# Patient Record
Sex: Female | Born: 1990 | Race: Asian | Hispanic: No | Smoking: Never smoker
Health system: Southern US, Community
[De-identification: ages and names within clinical notes are randomized; demographics above are authoritative.]

## PROBLEM LIST (undated history)

## (undated) ENCOUNTER — Inpatient Hospital Stay (HOSPITAL_COMMUNITY): Payer: Self-pay

## (undated) DIAGNOSIS — Z789 Other specified health status: Secondary | ICD-10-CM

## (undated) DIAGNOSIS — N939 Abnormal uterine and vaginal bleeding, unspecified: Secondary | ICD-10-CM

## (undated) HISTORY — PX: MOUTH SURGERY: SHX715

## (undated) HISTORY — PX: NO PAST SURGERIES: SHX2092

---

## 2012-08-01 ENCOUNTER — Encounter (HOSPITAL_COMMUNITY): Payer: Self-pay | Admitting: *Deleted

## 2012-08-01 ENCOUNTER — Inpatient Hospital Stay (HOSPITAL_COMMUNITY)
Admission: AD | Admit: 2012-08-01 | Discharge: 2012-08-01 | Disposition: A | Discharge status: Home / Self Care | Payer: No Typology Code available for payment source | Source: Ambulatory Visit | Attending: Obstetrics & Gynecology | Admitting: Obstetrics & Gynecology

## 2012-08-01 DIAGNOSIS — Z3201 Encounter for pregnancy test, result positive: Secondary | ICD-10-CM | POA: Insufficient documentation

## 2012-08-01 HISTORY — DX: Other specified health status: Z78.9

## 2012-08-01 NOTE — MAU Note (Signed)
Just wanting pregnancy verification, no real complaints or problems.

## 2012-08-01 NOTE — MAU Note (Signed)
Last wk has been having occ cramps like period was going to start, never did, also having breast tenderness. 2 pos home tests.

## 2012-08-01 NOTE — MAU Provider Note (Signed)
Attestation of Attending Supervision of Advanced Practitioner (CNM/NP): Evaluation and management procedures were performed by the Advanced Practitioner under my supervision and collaboration.  I have reviewed the Advanced Practitioner's note and chart, and I agree with the management and plan.  HARRAWAY-SMITH, Eleanora Guinyard 3:20 PM

## 2012-08-01 NOTE — MAU Provider Note (Signed)
22 y.o. G1P0 at [redacted]w[redacted]d here requesting pregnancy verification. Denies pain or bleeding. + UPT at home x 2.   BP 117/64  Pulse 73  Temp(Src) 98.1 F (36.7 C) (Oral)  Resp 16  Ht 5' 2.5" (1.588 m)  Wt 126 lb (57.153 kg)  BMI 22.66 kg/m2  LMP 06/08/2012  Gen: well, no distress  Results for orders placed during the hospital encounter of 08/01/12 (from the past 24 hour(s))  POCT PREGNANCY, URINE     Status: Abnormal   Collection Time    08/01/12 12:20 PM      Result Value Range   Preg Test, Ur POSITIVE (*) NEGATIVE    A/P: 1. Positive pregnancy test   Pregnancy verification and list of providers given Precautions rev'd Start care as soon as possible    Medication List     As of 08/01/2012  3:17 PM    Notice      You have not been prescribed any medications.             Follow-up Information   Follow up with provider of your choice. (start prenatal care as soon as possible)

## 2012-10-19 ENCOUNTER — Ambulatory Visit (INDEPENDENT_AMBULATORY_CARE_PROVIDER_SITE_OTHER): Payer: No Typology Code available for payment source | Admitting: Advanced Practice Midwife

## 2012-10-19 ENCOUNTER — Encounter: Payer: Self-pay | Admitting: Advanced Practice Midwife

## 2012-10-19 VITALS — BP 124/84 | Temp 97.7°F | Wt 126.0 lb

## 2012-10-19 DIAGNOSIS — Z3400 Encounter for supervision of normal first pregnancy, unspecified trimester: Secondary | ICD-10-CM

## 2012-10-19 DIAGNOSIS — O0930 Supervision of pregnancy with insufficient antenatal care, unspecified trimester: Secondary | ICD-10-CM | POA: Insufficient documentation

## 2012-10-19 LAB — OB RESULTS CONSOLE GC/CHLAMYDIA
Chlamydia: NEGATIVE
Gonorrhea: NEGATIVE

## 2012-10-19 LAB — POCT URINALYSIS DIP (DEVICE)
Hgb urine dipstick: NEGATIVE
Ketones, ur: NEGATIVE mg/dL
Leukocytes, UA: NEGATIVE
Protein, ur: NEGATIVE mg/dL
pH: 5.5 (ref 5.0–8.0)

## 2012-10-19 NOTE — Progress Notes (Signed)
Pulse: 102

## 2012-10-19 NOTE — Addendum Note (Signed)
Addended by: Franchot Mimes on: 10/19/2012 04:28 PM   Modules accepted: Orders

## 2012-10-19 NOTE — Progress Notes (Signed)
   Subjective:    Deanna Thornton is a G1P0 [redacted]w[redacted]d being seen today for her first obstetrical visit.  Her obstetrical history is significant for None. Patient does intend to breast feed. Pregnancy history fully reviewed.  Patient reports nausea.  Filed Vitals:   10/19/12 1028  BP: 124/84  Temp: 97.7 F (36.5 C)  Weight: 57.153 kg (126 lb)    HISTORY: OB History  Gravida Para Term Preterm AB SAB TAB Ectopic Multiple Living  1             # Outcome Date GA Lbr Len/2nd Weight Sex Delivery Anes PTL Lv  1 CUR              Past Medical History  Diagnosis Date  . Medical history non-contributory    Past Surgical History  Procedure Laterality Date  . No past surgeries     Family History  Problem Relation Age of Onset  . Hypertension Mother   . Hyperthyroidism Mother      Exam    Uterus:     Pelvic Exam:    Perineum: No Hemorrhoids, Normal Perineum   Vulva: normal   Vagina:  normal mucosa, normal discharge   pH:    Cervix: no bleeding following Pap, no cervical motion tenderness, no lesions and nulliparous appearance   Adnexa: normal adnexa and no mass, fullness, tenderness   Bony Pelvis: average  System: Breast:  normal appearance, no masses or tenderness   Skin: normal coloration and turgor, no rashes    Neurologic: oriented, normal mood, gait normal; reflexes normal and symmetric   Extremities: normal strength, tone, and muscle mass   HEENT neck supple with midline trachea and thyroid without masses   Mouth/Teeth mucous membranes moist, pharynx normal without lesions and dental hygiene good   Neck supple and no masses   Cardiovascular: regular rate and rhythm   Respiratory:  appears well, vitals normal, no respiratory distress, acyanotic, normal RR, ear and throat exam is normal, neck free of mass or lymphadenopathy, chest clear, no wheezing, crepitations, rhonchi, normal symmetric air entry   Abdomen: soft, non-tender; bowel sounds normal; no masses,  no organomegaly    Urinary: urethral meatus normal      Assessment:    Pregnancy: G1P0 Patient Active Problem List   Diagnosis Date Noted  . Supervision of normal first pregnancy 10/19/2012        Plan:     Initial labs drawn. Prenatal vitamins. Problem list reviewed and updated. Genetic Screening discussed Quad Screen: declined.  Ultrasound discussed; fetal survey: ordered.  Follow up in 4 weeks. 50% of 30 min visit spent on counseling and coordination of care.     LEFTWICH-KIRBY, Orion Mole 10/19/2012

## 2012-10-19 NOTE — Addendum Note (Signed)
Addended by: Sherre Lain A on: 10/19/2012 03:01 PM   Modules accepted: Orders

## 2012-10-21 LAB — CULTURE, OB URINE

## 2012-10-26 ENCOUNTER — Other Ambulatory Visit: Payer: Self-pay | Admitting: Advanced Practice Midwife

## 2012-10-26 ENCOUNTER — Ambulatory Visit (HOSPITAL_COMMUNITY)
Admission: RE | Admit: 2012-10-26 | Discharge: 2012-10-26 | Disposition: A | Discharge status: Home / Self Care | Payer: No Typology Code available for payment source | Source: Ambulatory Visit | Attending: Obstetrics & Gynecology | Admitting: Obstetrics & Gynecology

## 2012-10-26 ENCOUNTER — Ambulatory Visit (HOSPITAL_COMMUNITY)
Admission: RE | Admit: 2012-10-26 | Discharge: 2012-10-26 | Disposition: A | Discharge status: Home / Self Care | Payer: No Typology Code available for payment source | Source: Ambulatory Visit | Attending: Advanced Practice Midwife | Admitting: Advanced Practice Midwife

## 2012-10-26 DIAGNOSIS — Z363 Encounter for antenatal screening for malformations: Secondary | ICD-10-CM | POA: Insufficient documentation

## 2012-10-26 DIAGNOSIS — O358XX0 Maternal care for other (suspected) fetal abnormality and damage, not applicable or unspecified: Secondary | ICD-10-CM | POA: Insufficient documentation

## 2012-10-26 DIAGNOSIS — Z3400 Encounter for supervision of normal first pregnancy, unspecified trimester: Secondary | ICD-10-CM

## 2012-10-26 DIAGNOSIS — Z1389 Encounter for screening for other disorder: Secondary | ICD-10-CM | POA: Insufficient documentation

## 2012-11-07 ENCOUNTER — Other Ambulatory Visit: Payer: Self-pay | Admitting: Advanced Practice Midwife

## 2012-11-07 DIAGNOSIS — O350XX0 Maternal care for (suspected) central nervous system malformation in fetus, not applicable or unspecified: Secondary | ICD-10-CM | POA: Insufficient documentation

## 2012-11-07 DIAGNOSIS — Z3400 Encounter for supervision of normal first pregnancy, unspecified trimester: Secondary | ICD-10-CM

## 2012-11-09 NOTE — Progress Notes (Signed)
Attempted to call patient. NO answer, left message to call clinic back for some important message.

## 2012-11-16 ENCOUNTER — Ambulatory Visit (INDEPENDENT_AMBULATORY_CARE_PROVIDER_SITE_OTHER): Payer: No Typology Code available for payment source | Admitting: Family Medicine

## 2012-11-16 ENCOUNTER — Encounter: Payer: Self-pay | Admitting: *Deleted

## 2012-11-16 ENCOUNTER — Encounter: Payer: Self-pay | Admitting: Family Medicine

## 2012-11-16 VITALS — BP 109/74 | Temp 98.7°F | Wt 131.8 lb

## 2012-11-16 DIAGNOSIS — Z23 Encounter for immunization: Secondary | ICD-10-CM

## 2012-11-16 DIAGNOSIS — O0930 Supervision of pregnancy with insufficient antenatal care, unspecified trimester: Secondary | ICD-10-CM

## 2012-11-16 DIAGNOSIS — Z3402 Encounter for supervision of normal first pregnancy, second trimester: Secondary | ICD-10-CM

## 2012-11-16 LAB — POCT URINALYSIS DIP (DEVICE)
Bilirubin Urine: NEGATIVE
Ketones, ur: NEGATIVE mg/dL
Protein, ur: NEGATIVE mg/dL
Specific Gravity, Urine: >=1.03 (ref 1.005–1.030)
pH: 6 (ref 5.0–8.0)

## 2012-11-16 MED ORDER — INFLUENZA VIRUS VACC SPLIT PF IM SUSP
0.5000 mL | Freq: Once | INTRAMUSCULAR | Status: AC
  Administered 2012-11-16: 0.5 mL via INTRAMUSCULAR

## 2012-11-16 MED ORDER — PRENATAL VITAMINS 0.8 MG PO TABS: 1.0000 | ORAL_TABLET | Freq: Every day | ORAL | Status: DC

## 2012-11-16 NOTE — Progress Notes (Signed)
P=85,  

## 2012-11-16 NOTE — Patient Instructions (Signed)
Pregnancy - Second Trimester The second trimester of pregnancy (3 to 6 months) is a period of rapid growth for you and your baby. At the end of the sixth month, your baby is about 9 inches long and weighs 1 1/2 pounds. You will begin to feel the baby move between 18 and 20 weeks of the pregnancy. This is called quickening. Weight gain is faster. A clear fluid (colostrum) may leak out of your breasts. You may feel small contractions of the womb (uterus). This is known as false labor or Braxton-Hicks contractions. This is like a practice for labor when the baby is ready to be born. Usually, the problems with morning sickness have usually passed by the end of your first trimester. Some women develop small dark blotches (called cholasma, mask of pregnancy) on their face that usually goes away after the baby is born. Exposure to the sun makes the blotches worse. Acne may also develop in some pregnant women and pregnant women who have acne, may find that it goes away. PRENATAL EXAMS  Blood work may continue to be done during prenatal exams. These tests are done to check on your health and the probable health of your baby. Blood work is used to follow your blood levels (hemoglobin). Anemia (low hemoglobin) is common during pregnancy. Iron and vitamins are given to help prevent this. You will also be checked for diabetes between 24 and 28 weeks of the pregnancy. Some of the previous blood tests may be repeated.  The size of the uterus is measured during each visit. This is to make sure that the baby is continuing to grow properly according to the dates of the pregnancy.  Your blood pressure is checked every prenatal visit. This is to make sure you are not getting toxemia.  Your urine is checked to make sure you do not have an infection, diabetes or protein in the urine.  Your weight is checked often to make sure gains are happening at the suggested rate. This is to ensure that both you and your baby are  growing normally.  Sometimes, an ultrasound is performed to confirm the proper growth and development of the baby. This is a test which bounces harmless sound waves off the baby so your caregiver can more accurately determine due dates. Sometimes, a test is done on the amniotic fluid surrounding the baby. This test is called an amniocentesis. The amniotic fluid is obtained by sticking a needle into the belly (abdomen). This is done to check the chromosomes in instances where there is a concern about possible genetic problems with the baby. It is also sometimes done near the end of pregnancy if an early delivery is required. In this case, it is done to help make sure the baby's lungs are mature enough for the baby to live outside of the womb. CHANGES OCCURING IN THE SECOND TRIMESTER OF PREGNANCY Your body goes through many changes during pregnancy. They vary from person to person. Talk to your caregiver about changes you notice that you are concerned about.  During the second trimester, you will likely have an increase in your appetite. It is normal to have cravings for certain foods. This varies from person to person and pregnancy to pregnancy.  Your lower abdomen will begin to bulge.  You may have to urinate more often because the uterus and baby are pressing on your bladder. It is also common to get more bladder infections during pregnancy. You can help this by drinking lots of fluids   and emptying your bladder before and after intercourse.  You may begin to get stretch marks on your hips, abdomen, and breasts. These are normal changes in the body during pregnancy. There are no exercises or medicines to take that prevent this change.  You may begin to develop swollen and bulging veins (varicose veins) in your legs. Wearing support hose, elevating your feet for 15 minutes, 3 to 4 times a day and limiting salt in your diet helps lessen the problem.  Heartburn may develop as the uterus grows and  pushes up against the stomach. Antacids recommended by your caregiver helps with this problem. Also, eating smaller meals 4 to 5 times a day helps.  Constipation can be treated with a stool softener or adding bulk to your diet. Drinking lots of fluids, and eating vegetables, fruits, and whole grains are helpful.  Exercising is also helpful. If you have been very active up until your pregnancy, most of these activities can be continued during your pregnancy. If you have been less active, it is helpful to start an exercise program such as walking.  Hemorrhoids may develop at the end of the second trimester. Warm sitz baths and hemorrhoid cream recommended by your caregiver helps hemorrhoid problems.  Backaches may develop during this time of your pregnancy. Avoid heavy lifting, wear low heal shoes, and practice good posture to help with backache problems.  Some pregnant women develop tingling and numbness of their hand and fingers because of swelling and tightening of ligaments in the wrist (carpel tunnel syndrome). This goes away after the baby is born.  As your breasts enlarge, you may have to get a bigger bra. Get a comfortable, cotton, support bra. Do not get a nursing bra until the last month of the pregnancy if you will be nursing the baby.  You may get a dark line from your belly button to the pubic area called the linea nigra.  You may develop rosy cheeks because of increase blood flow to the face.  You may develop spider looking lines of the face, neck, arms, and chest. These go away after the baby is born. HOME CARE INSTRUCTIONS   It is extremely important to avoid all smoking, herbs, alcohol, and unprescribed drugs during your pregnancy. These chemicals affect the formation and growth of the baby. Avoid these chemicals throughout the pregnancy to ensure the delivery of a healthy infant.  Most of your home care instructions are the same as suggested for the first trimester of your  pregnancy. Keep your caregiver's appointments. Follow your caregiver's instructions regarding medicine use, exercise, and diet.  During pregnancy, you are providing food for you and your baby. Continue to eat regular, well-balanced meals. Choose foods such as meat, fish, milk and other low fat dairy products, vegetables, fruits, and whole-grain breads and cereals. Your caregiver will tell you of the ideal weight gain.  A physical sexual relationship may be continued up until near the end of pregnancy if there are no other problems. Problems could include early (premature) leaking of amniotic fluid from the membranes, vaginal bleeding, abdominal pain, or other medical or pregnancy problems.  Exercise regularly if there are no restrictions. Check with your caregiver if you are unsure of the safety of some of your exercises. The greatest weight gain will occur in the last 2 trimesters of pregnancy. Exercise will help you:  Control your weight.  Get you in shape for labor and delivery.  Lose weight after you have the baby.  Wear   a good support or jogging bra for breast tenderness during pregnancy. This may help if worn during sleep. Pads or tissues may be used in the bra if you are leaking colostrum.  Do not use hot tubs, steam rooms or saunas throughout the pregnancy.  Wear your seat belt at all times when driving. This protects you and your baby if you are in an accident.  Avoid raw meat, uncooked cheese, cat litter boxes, and soil used by cats. These carry germs that can cause birth defects in the baby.  The second trimester is also a good time to visit your dentist for your dental health if this has not been done yet. Getting your teeth cleaned is okay. Use a soft toothbrush. Brush gently during pregnancy.  It is easier to leak urine during pregnancy. Tightening up and strengthening the pelvic muscles will help with this problem. Practice stopping your urination while you are going to the  bathroom. These are the same muscles you need to strengthen. It is also the muscles you would use as if you were trying to stop from passing gas. You can practice tightening these muscles up 10 times a set and repeating this about 3 times per day. Once you know what muscles to tighten up, do not perform these exercises during urination. It is more likely to contribute to an infection by backing up the urine.  Ask for help if you have financial, counseling, or nutritional needs during pregnancy. Your caregiver will be able to offer counseling for these needs as well as refer you for other special needs.  Your skin may become oily. If so, wash your face with mild soap, use non-greasy moisturizer and oil or cream based makeup. MEDICINES AND DRUG USE IN PREGNANCY  Take prenatal vitamins as directed. The vitamin should contain 1 milligram of folic acid. Keep all vitamins out of reach of children. Only a couple vitamins or tablets containing iron may be fatal to a baby or young child when ingested.  Avoid use of all medicines, including herbs, over-the-counter medicines, not prescribed or suggested by your caregiver. Only take over-the-counter or prescription medicines for pain, discomfort, or fever as directed by your caregiver. Do not use aspirin.  Let your caregiver also know about herbs you may be using.  Alcohol is related to a number of birth defects. This includes fetal alcohol syndrome. All alcohol, in any form, should be avoided completely. Smoking will cause low birth rate and premature babies.  Street or illegal drugs are very harmful to the baby. They are absolutely forbidden. A baby born to an addicted mother will be addicted at birth. The baby will go through the same withdrawal an adult does. SEEK MEDICAL CARE IF:  You have any concerns or worries during your pregnancy. It is better to call with your questions if you feel they cannot wait, rather than worry about them. SEEK IMMEDIATE  MEDICAL CARE IF:   An unexplained oral temperature above 102 F (38.9 C) develops, or as your caregiver suggests.  You have leaking of fluid from the vagina (birth canal). If leaking membranes are suspected, take your temperature and tell your caregiver of this when you call.  There is vaginal spotting, bleeding, or passing clots. Tell your caregiver of the amount and how many pads are used. Light spotting in pregnancy is common, especially following intercourse.  You develop a bad smelling vaginal discharge with a change in the color from clear to white.  You continue to feel   sick to your stomach (nauseated) and have no relief from remedies suggested. You vomit blood or coffee ground-like materials.  You lose more than 2 pounds of weight or gain more than 2 pounds of weight over 1 week, or as suggested by your caregiver.  You notice swelling of your face, hands, feet, or legs.  You get exposed to German measles and have never had them.  You are exposed to fifth disease or chickenpox.  You develop belly (abdominal) pain. Round ligament discomfort is a common non-cancerous (benign) cause of abdominal pain in pregnancy. Your caregiver still must evaluate you.  You develop a bad headache that does not go away.  You develop fever, diarrhea, pain with urination, or shortness of breath.  You develop visual problems, blurry, or double vision.  You fall or are in a car accident or any kind of trauma.  There is mental or physical violence at home. Document Released: 01/27/2001 Document Revised: 10/28/2011 Document Reviewed: 08/01/2008 [employer] Patient Information 2014 [employer].  

## 2012-11-16 NOTE — Progress Notes (Signed)
S: pt is a 22 yo G1 @[redacted]w[redacted]d  here for robv - no ctx, lof, vb. +FM - has gained 3lbs  O: see flowsheet  A/P - somehow missed the initial labs. Will draw today - flu shot today - choroid plexus cyst and echogenic focus on [place] and was supposed to see MFM but appt wasn't scheduled. appt scheduled today - weight gain of 3 lbs. Discussed normal weight gain. Will con to monitor - PNV sent to pharmacy  - f/u in 4 weeks.

## 2012-11-17 LAB — OBSTETRIC PANEL
Basophils Absolute: 0 10*3/uL (ref 0.0–0.1)
Basophils Relative: 0 % (ref 0–1)
HCT: 34 % — ABNORMAL LOW (ref 36.0–46.0)
Hepatitis B Surface Ag: NEGATIVE
Lymphocytes Relative: 18 % (ref 12–46)
MCHC: 35.6 g/dL (ref 30.0–36.0)
Neutro Abs: 5.5 10*3/uL (ref 1.7–7.7)
Neutrophils Relative %: 75 % (ref 43–77)
Platelets: 188 10*3/uL (ref 150–400)
RDW: 13.5 % (ref 11.5–15.5)
Rubella: 1.41 Index — ABNORMAL HIGH (ref <0.90)
WBC: 7.4 10*3/uL (ref 4.0–10.5)

## 2012-11-22 ENCOUNTER — Other Ambulatory Visit: Payer: Self-pay | Admitting: Family

## 2012-11-22 DIAGNOSIS — O283 Abnormal ultrasonic finding on antenatal screening of mother: Secondary | ICD-10-CM

## 2012-11-23 ENCOUNTER — Ambulatory Visit (HOSPITAL_COMMUNITY)
Admission: RE | Admit: 2012-11-23 | Discharge: 2012-11-23 | Disposition: A | Discharge status: Home / Self Care | Payer: No Typology Code available for payment source | Source: Ambulatory Visit | Attending: Family Medicine | Admitting: Family Medicine

## 2012-11-23 ENCOUNTER — Encounter (HOSPITAL_COMMUNITY): Payer: Self-pay

## 2012-11-23 DIAGNOSIS — O358XX0 Maternal care for other (suspected) fetal abnormality and damage, not applicable or unspecified: Secondary | ICD-10-CM | POA: Insufficient documentation

## 2012-11-23 DIAGNOSIS — O283 Abnormal ultrasonic finding on antenatal screening of mother: Secondary | ICD-10-CM

## 2012-11-23 NOTE — Progress Notes (Signed)
Deanna Thornton  was seen today for an ultrasound appointment.  See full report in [facility].  Comments: An echogenic focus was seen in the left cardiac ventricle.  This is felt to represent a calcified papillary muscle, and is not associated with structural or functional cardiac abnormalities.  Although an echogenic cardiac focus may be associated with an increased risk of Down syndrome, this risk is felt to be minimal, especially when it is seen as an isolated finding.    Impression: Single IUP at 22 2/7 weeks Echogenic intracardiac focus noted in the left ventricle (see comments) The previously noted choroid plexus cysts are not visualized today The fetal anatomic survey is otherwise within normal limits No other markers associated with aneuploidy were noted Normal amniotic fluid volume  Recommendations: Follow-up ultrasounds as clinically indicated.   Alpha Gula, MD

## 2012-11-23 NOTE — Progress Notes (Signed)
Genetic Counseling  High-Risk Gestation Note  Appointment Date:  11/23/2012 Referred By: Deanna Haven, MD Date of Birth:  1990-08-23 Partner:  Deanna Thornton   Pregnancy History: G1P0 Estimated Date of Delivery: 03/15/13 Estimated Gestational Age: [redacted]w[redacted]d Attending: Alpha Gula, MD  Deanna Thornton and her partner, Deanna Thornton, were seen for genetic counseling because of abnormal ultrasound findings.    Deanna Thornton [facility] [place] on October 26, 2012 for ultrasound and consultation.  Ultrasound revealed an echogenic intracardiac focus (EIF) and bilateral choroid plexus cysts. The ultrasound report was documented separately.  She returned today for a follow-up ultrasound; the choroid plexus cysts were resolved, but the EIF was still present.  We discussed that the second trimester genetic sonogram is targeted at identifying features associated with aneuploidy.  It has evolved as a screening tool used to provide an individualized risk assessment for Down syndrome and other trisomies.  The ability of sonography to aid in the detection of aneuploidies relies on identification of both major structural anomalies and "soft markers."  The patient was counseled that the latter term refers to findings that are often normal variants and do not cause any significant medical problems.  Nonetheless, these markers have a known association with aneuploidy.    The patient was counseled that an EIF is characterized by calcified papillary muscle leading to a discreet dot in the left, or less commonly, the right ventricle.  We discussed that this finding is typically considered to be a benign variant; however, the risk of aneuploidy is increased when this marker is found in patients who have additional risk factors for fetal aneuploidy (AMA, abnormal screening test, or other markers or anomalies by fetal ultrasound).   We then discussed that the choroid plexus is an  area in the brain where cerebral spinal fluid, the fluid that bathes the brain and spinal cord, is made.  Cysts, or fluid filled sacs, are sometimes found in the choroid plexus of babies both before and after they are born.  We discussed that approximately 1-3% of pregnancies evaluated by ultrasound will show choroid plexus cyst (CPCs).  Literature suggests that CPCs are an ultrasound finding in approximately 30-50% of fetuses with trisomy 18, but are an isolated finding in less than 10% of fetuses with trisomy 51.  Deanna Thornton was counseled that when a patient has other risk factors for fetal trisomy 41 (abnormal First trimester or quad screening, advanced maternal age, or another ultrasound finding), CPCs are associated with an increased risk (LR of 9) for trisomy 54.  Newer literature suggests that in the absence of other risk factors, CPCs are likely a normal variation of development or a benign finding.  CPCs are not associated with an increased risk for fetal Down syndrome. We discussed that although the fetus had two markers for fetal aneuploidy, these markers are specific for different conditions.  Considering Deanna Thornton's maternal age of 27, and that she does not have other risk factors for fetal aneuploidy, we discussed that the risk of fetal aneuploidy in the pregnancy is expected to be low (<1%).  We reviewed chromosomes, nondisjunction, and the common features and prognosis of Down syndrome and trisomy 75.  We reviewed other available screening and diagnostic options including noninvasive prenatal screening (NIPS)/cell free fetal DNA (cffDNA) testing, and amniocentesis.  She was counseled regarding the benefits and limitations of each option.  We reviewed the approximate 1 in 300-500 risk for  complications for amniocentesis, including spontaneous pregnancy loss. After consideration of all the options, this couple declined the options of additional testing.  Both family histories were briefly reviewed  and found to be noncontributory for birth defects, mental retardation, and known genetic conditions. Without further information regarding the provided family history, an accurate genetic risk cannot be calculated. Further genetic counseling is warranted if more information is obtained.  Deanna Thornton denied exposure to environmental toxins or chemical agents. She denied the use of alcohol, tobacco or street drugs. She denied significant viral illnesses during the course of her pregnancy.   I counseled this couple regarding the above risks and available options.  The approximate face-to-face time with the genetic counselor was 24 minutes.  Deanna Prose, MS Certified Genetic Counselor

## 2012-11-25 ENCOUNTER — Encounter: Payer: Self-pay | Admitting: Family

## 2012-12-14 ENCOUNTER — Ambulatory Visit (INDEPENDENT_AMBULATORY_CARE_PROVIDER_SITE_OTHER): Payer: No Typology Code available for payment source | Admitting: Family

## 2012-12-14 VITALS — BP 102/70 | Temp 98.1°F | Wt 134.5 lb

## 2012-12-14 DIAGNOSIS — O0930 Supervision of pregnancy with insufficient antenatal care, unspecified trimester: Secondary | ICD-10-CM

## 2012-12-14 DIAGNOSIS — Z23 Encounter for immunization: Secondary | ICD-10-CM

## 2012-12-14 DIAGNOSIS — O0932 Supervision of pregnancy with insufficient antenatal care, second trimester: Secondary | ICD-10-CM

## 2012-12-14 LAB — CBC
HCT: 33.1 % — ABNORMAL LOW (ref 36.0–46.0)
Hemoglobin: 11.5 g/dL — ABNORMAL LOW (ref 12.0–15.0)
MCH: 31.3 pg (ref 26.0–34.0)
MCHC: 34.7 g/dL (ref 30.0–36.0)
RBC: 3.67 MIL/uL — ABNORMAL LOW (ref 3.87–5.11)

## 2012-12-14 LAB — POCT URINALYSIS DIP (DEVICE)
Hgb urine dipstick: NEGATIVE
Ketones, ur: NEGATIVE mg/dL
Protein, ur: NEGATIVE mg/dL
Specific Gravity, Urine: >=1.03 (ref 1.005–1.030)
Urobilinogen, UA: 0.2 mg/dL (ref 0.0–1.0)

## 2012-12-14 MED ORDER — TETANUS-DIPHTH-ACELL PERTUSSIS 5-2.5-18.5 LF-MCG/0.5 IM SUSP
0.5000 mL | Freq: Once | INTRAMUSCULAR | Status: AC
  Administered 2012-12-14: 0.5 mL via INTRAMUSCULAR

## 2012-12-14 NOTE — Addendum Note (Signed)
Addended by: Franchot Mimes on: 12/14/2012 11:49 AM   Modules accepted: Orders

## 2012-12-14 NOTE — Progress Notes (Signed)
Reports doing well; no questions or concerns.  28 wk labs today.

## 2012-12-14 NOTE — Progress Notes (Signed)
P = 87 

## 2012-12-15 ENCOUNTER — Encounter: Payer: Self-pay | Admitting: Family

## 2012-12-15 LAB — RPR

## 2012-12-15 LAB — HIV ANTIBODY (ROUTINE TESTING W REFLEX): HIV: NONREACTIVE

## 2012-12-15 LAB — GLUCOSE TOLERANCE, 1 HOUR (50G) W/O FASTING: Glucose, 1 Hour GTT: 142 mg/dL — ABNORMAL HIGH (ref 70–140)

## 2012-12-28 ENCOUNTER — Ambulatory Visit (INDEPENDENT_AMBULATORY_CARE_PROVIDER_SITE_OTHER): Payer: No Typology Code available for payment source | Admitting: Advanced Practice Midwife

## 2012-12-28 VITALS — BP 105/58 | Wt 138.8 lb

## 2012-12-28 DIAGNOSIS — O350XX0 Maternal care for (suspected) central nervous system malformation in fetus, not applicable or unspecified: Secondary | ICD-10-CM

## 2012-12-28 DIAGNOSIS — R7309 Other abnormal glucose: Secondary | ICD-10-CM | POA: Insufficient documentation

## 2012-12-28 DIAGNOSIS — O0930 Supervision of pregnancy with insufficient antenatal care, unspecified trimester: Secondary | ICD-10-CM

## 2012-12-28 DIAGNOSIS — O99810 Abnormal glucose complicating pregnancy: Secondary | ICD-10-CM

## 2012-12-28 LAB — POCT URINALYSIS DIP (DEVICE)
Glucose, UA: NEGATIVE mg/dL
Hgb urine dipstick: NEGATIVE
Ketones, ur: NEGATIVE mg/dL
Specific Gravity, Urine: 1.01 (ref 1.005–1.030)

## 2012-12-28 NOTE — Patient Instructions (Signed)
Glucose, Blood Sugar, Fasting Blood Sugar This is a test to measure your blood sugar. Glucose is a simple sugar that serves as the main source of energy for the body. The carbohydrates we eat are broken down into glucose (and a few other simple sugars), absorbed by the small intestine, and circulated throughout the body. Most of the body's cells require glucose for energy production; brain and nervous system cells not only rely on glucose for energy, they can only function when glucose levels in the blood remain above a certain level.  The body's use of glucose hinges on the availability of insulin, a hormone produced by the pancreas. Insulin acts as a Engineer, maintenance, transporting glucose into the body's cells, directing the body to store excess glucose as glycogen (for short-term storage) and/or as triglycerides in fat cells. We can not live without glucose or insulin, and they must be in balance.  Normally, blood glucose levels rise slightly after a meal, and insulin is secreted to lower them, with the amount of insulin released matched up with the size and content of the meal. If blood glucose levels drop too low, such as might occur in between meals or after a strenuous workout, glucagon (another pancreatic hormone) is secreted to tell the liver to turn some glycogen back into glucose, raising the blood glucose levels. If the glucose/insulin feedback mechanism is working properly, the amount of glucose in the blood remains fairly stable. If the balance is disrupted and glucose levels in the blood rise, then the body tries to restore the balance, both by increasing insulin production and by excreting glucose in the urine.  PREPARATION FOR TEST A blood sample drawn from a vein in your arm or, for a self check, a drop of blood from a skin prick; in general, it may be recommended that you fast before having a blood glucose test; sometimes a random (no preparation) urine sample is used. Your caregiver will  instruct you as to what they want prior to your testing. NORMAL FINDINGS Normal values depend on many factors. Your lab will provide a range of normal values with your test results. The following information summarizes the meaning of the test results. These are based on the clinical practice recommendations of the [employer] Diabetes Association.  FASTING BLOOD GLUCOSE  From 70 to 99 mg/dL (3.9 to 5.5 mmol/L): Normal glucose tolerance  From 100 to 125 mg/dL (5.6 to 6.9 mmol/L):Impaired fasting glucose (pre-diabetes)  126 mg/dL (7.0 mmol/L) and above on more than one testing occasion: Diabetes ORAL GLUCOSE TOLERANCE TEST (OGTT) [EXCEPT PREGNANCY] (2 HOURS AFTER A 75-GRAM GLUCOSE DRINK)  Less than 140 mg/dL (7.8 mmol/L): Normal glucose tolerance  From 140 to 200 mg/dL (7.8 to 40.0 mmol/L): Impaired glucose tolerance (pre-diabetes)  Over 200 mg/dL (86.7 mmol/L) on more than one testing occasion: Diabetes GESTATIONAL DIABETES SCREENING: GLUCOSE CHALLENGE TEST (1 HOUR AFTER A 50-GRAM GLUCOSE DRINK)  Less than 140* mg/dL (7.8 mmol/L): Normal glucose tolerance  140* mg/dL (7.8 mmol/L) and over: Abnormal, needs OGTT (see below) * Some use a cutoff of More Than 130 mg/dL (7.2 mmol/L) because that identifies 90% of women with gestational diabetes, compared to 80% identified using the threshold of More Than 140 mg/dL (7.8 mmol/L). GESTATIONAL DIABETES DIAGNOSTIC: OGTT (100-GRAM GLUCOSE DRINK)  Fasting*..........................................95 mg/dL (5.3 mmol/L)  1 hour after glucose load*..............180 mg/dL (61.9 mmol/L)  2 hours after glucose load*.............155 mg/dL (8.6 mmol/L)  3 hours after glucose load* **.........140 mg/dL (7.8 mmol/L) * If two or more values  are above the criteria, gestational diabetes is diagnosed. °** A 75-gram glucose load may be used, although this method is not as well validated as the 100-gram OGTT; the 3-hour sample is not drawn if 75 grams is used.    °Ranges for normal findings may vary among different laboratories and hospitals. You should always check with your doctor after having lab work or other tests done to discuss the meaning of your test results and whether your values are considered within normal limits. °MEANING OF TEST  °Your caregiver will go over the test results with you and discuss the importance and meaning of your results, as well as treatment options and the need for additional tests if necessary. °OBTAINING THE TEST RESULTS °It is your responsibility to obtain your test results. Ask the lab or department performing the test when and how you will get your results. °Document Released: 03/06/2004 Document Revised: 04/27/2011 Document Reviewed: 01/14/2008 °ExitCare® Patient Information ©2014 [employer]. ° °

## 2012-12-28 NOTE — Progress Notes (Signed)
Pulse: 81 142 on her 1 hr gtt. Informed her to come back for 3 hr.

## 2012-12-28 NOTE — Progress Notes (Signed)
Doing well. Some constipation, remedies reviewed.   Discussed Glucola results, 3 hr test, and possible treatment if she comes up diabetic.

## 2013-01-04 ENCOUNTER — Encounter: Payer: Self-pay | Admitting: Obstetrics and Gynecology

## 2013-01-04 ENCOUNTER — Other Ambulatory Visit: Payer: No Typology Code available for payment source

## 2013-01-04 DIAGNOSIS — O99810 Abnormal glucose complicating pregnancy: Secondary | ICD-10-CM

## 2013-01-04 LAB — GLUCOSE TOLERANCE, 3 HOURS
Glucose Tolerance, 1 hour: 137 mg/dL (ref 70–189)
Glucose Tolerance, Fasting: 69 mg/dL — ABNORMAL LOW (ref 70–104)
Glucose, GTT - 3 Hour: 115 mg/dL (ref 70–144)

## 2013-01-11 ENCOUNTER — Ambulatory Visit (INDEPENDENT_AMBULATORY_CARE_PROVIDER_SITE_OTHER): Payer: No Typology Code available for payment source | Admitting: Advanced Practice Midwife

## 2013-01-11 ENCOUNTER — Encounter: Payer: Self-pay | Admitting: Advanced Practice Midwife

## 2013-01-11 VITALS — BP 95/62 | Temp 97.3°F | Wt 141.0 lb

## 2013-01-11 DIAGNOSIS — O0930 Supervision of pregnancy with insufficient antenatal care, unspecified trimester: Secondary | ICD-10-CM

## 2013-01-11 DIAGNOSIS — R7309 Other abnormal glucose: Secondary | ICD-10-CM

## 2013-01-11 LAB — POCT URINALYSIS DIP (DEVICE)
Bilirubin Urine: NEGATIVE
Glucose, UA: NEGATIVE mg/dL
Nitrite: NEGATIVE
Urobilinogen, UA: 0.2 mg/dL (ref 0.0–1.0)

## 2013-01-11 NOTE — Progress Notes (Signed)
Doing well. Informed glucose tolerance test is negative.

## 2013-01-11 NOTE — Progress Notes (Signed)
Pulse 70 

## 2013-01-11 NOTE — Patient Instructions (Signed)
Third Trimester of Pregnancy  The third trimester is from week 29 through week 42, months 7 through 9. The third trimester is a time when the fetus is growing rapidly. At the end of the ninth month, the fetus is about 20 inches in length and weighs 6 10 pounds.   BODY CHANGES  Your body goes through many changes during pregnancy. The changes vary from woman to woman.    Your weight will continue to increase. You can expect to gain 25 35 pounds (11 16 kg) by the end of the pregnancy.   You may begin to get stretch marks on your hips, abdomen, and breasts.   You may urinate more often because the fetus is moving lower into your pelvis and pressing on your bladder.   You may develop or continue to have heartburn as a result of your pregnancy.   You may develop constipation because certain hormones are causing the muscles that push waste through your intestines to slow down.   You may develop hemorrhoids or swollen, bulging veins (varicose veins).   You may have pelvic pain because of the weight gain and pregnancy hormones relaxing your joints between the bones in your pelvis. Back aches may result from over exertion of the muscles supporting your posture.   Your breasts will continue to grow and be tender. A yellow discharge may leak from your breasts called colostrum.   Your belly button may stick out.   You may feel short of breath because of your expanding uterus.   You may notice the fetus "dropping," or moving lower in your abdomen.   You may have a bloody mucus discharge. This usually occurs a few days to a week before labor begins.   Your cervix becomes thin and soft (effaced) near your due date.  WHAT TO EXPECT AT YOUR PRENATAL EXAMS   You will have prenatal exams every 2 weeks until week 36. Then, you will have weekly prenatal exams. During a routine prenatal visit:   You will be weighed to make sure you and the fetus are growing normally.   Your blood pressure is taken.   Your abdomen will be  measured to track your baby's growth.   The fetal heartbeat will be listened to.   Any test results from the previous visit will be discussed.   You may have a cervical check near your due date to see if you have effaced.  At around 36 weeks, your caregiver will check your cervix. At the same time, your caregiver will also perform a test on the secretions of the vaginal tissue. This test is to determine if a type of bacteria, Group B streptococcus, is present. Your caregiver will explain this further.  Your caregiver may ask you:   What your birth plan is.   How you are feeling.   If you are feeling the baby move.   If you have had any abnormal symptoms, such as leaking fluid, bleeding, severe headaches, or abdominal cramping.   If you have any questions.  Other tests or screenings that may be performed during your third trimester include:   Blood tests that check for low iron levels (anemia).   Fetal testing to check the health, activity level, and growth of the fetus. Testing is done if you have certain medical conditions or if there are problems during the pregnancy.  FALSE LABOR  You may feel small, irregular contractions that eventually go away. These are called Braxton Hicks contractions, or   false labor. Contractions may last for hours, days, or even weeks before true labor sets in. If contractions come at regular intervals, intensify, or become painful, it is best to be seen by your caregiver.   SIGNS OF LABOR    Menstrual-like cramps.   Contractions that are 5 minutes apart or less.   Contractions that start on the top of the uterus and spread down to the lower abdomen and back.   A sense of increased pelvic pressure or back pain.   A watery or bloody mucus discharge that comes from the vagina.  If you have any of these signs before the 37th week of pregnancy, call your caregiver right away. You need to go to the hospital to get checked immediately.  HOME CARE INSTRUCTIONS    Avoid all  smoking, herbs, alcohol, and unprescribed drugs. These chemicals affect the formation and growth of the baby.   Follow your caregiver's instructions regarding medicine use. There are medicines that are either safe or unsafe to take during pregnancy.   Exercise only as directed by your caregiver. Experiencing uterine cramps is a good sign to stop exercising.   Continue to eat regular, healthy meals.   Wear a good support bra for breast tenderness.   Do not use hot tubs, steam rooms, or saunas.   Wear your seat belt at all times when driving.   Avoid raw meat, uncooked cheese, cat litter boxes, and soil used by cats. These carry germs that can cause birth defects in the baby.   Take your prenatal vitamins.   Try taking a stool softener (if your caregiver approves) if you develop constipation. Eat more high-fiber foods, such as fresh vegetables or fruit and whole grains. Drink plenty of fluids to keep your urine clear or pale yellow.   Take warm sitz baths to soothe any pain or discomfort caused by hemorrhoids. Use hemorrhoid cream if your caregiver approves.   If you develop varicose veins, wear support hose. Elevate your feet for 15 minutes, 3 4 times a day. Limit salt in your diet.   Avoid heavy lifting, wear low heal shoes, and practice good posture.   Rest a lot with your legs elevated if you have leg cramps or low back pain.   Visit your dentist if you have not gone during your pregnancy. Use a soft toothbrush to brush your teeth and be gentle when you floss.   A sexual relationship may be continued unless your caregiver directs you otherwise.   Do not travel far distances unless it is absolutely necessary and only with the approval of your caregiver.   Take prenatal classes to understand, practice, and ask questions about the labor and delivery.   Make a trial run to the hospital.   Pack your hospital bag.   Prepare the baby's nursery.   Continue to go to all your prenatal visits as directed  by your caregiver.  SEEK MEDICAL CARE IF:   You are unsure if you are in labor or if your water has broken.   You have dizziness.   You have mild pelvic cramps, pelvic pressure, or nagging pain in your abdominal area.   You have persistent nausea, vomiting, or diarrhea.   You have a bad smelling vaginal discharge.   You have pain with urination.  SEEK IMMEDIATE MEDICAL CARE IF:    You have a fever.   You are leaking fluid from your vagina.   You have spotting or bleeding from your vagina.     You have severe abdominal cramping or pain.   You have rapid weight loss or gain.   You have shortness of breath with chest pain.   You notice sudden or extreme swelling of your face, hands, ankles, feet, or legs.   You have not felt your baby move in over an hour.   You have severe headaches that do not go away with medicine.   You have vision changes.  Document Released: 01/27/2001 Document Revised: 10/05/2012 Document Reviewed: 04/05/2012  Exit[employer] Patient Information 2014 [employer].

## 2013-01-25 ENCOUNTER — Encounter: Payer: Self-pay | Admitting: Obstetrics and Gynecology

## 2013-01-25 ENCOUNTER — Ambulatory Visit (INDEPENDENT_AMBULATORY_CARE_PROVIDER_SITE_OTHER): Payer: No Typology Code available for payment source | Admitting: Obstetrics and Gynecology

## 2013-01-25 VITALS — BP 106/65 | Temp 97.1°F | Wt 144.4 lb

## 2013-01-25 DIAGNOSIS — Z3400 Encounter for supervision of normal first pregnancy, unspecified trimester: Secondary | ICD-10-CM

## 2013-01-25 DIAGNOSIS — Z3403 Encounter for supervision of normal first pregnancy, third trimester: Secondary | ICD-10-CM

## 2013-01-25 LAB — POCT URINALYSIS DIP (DEVICE)
Bilirubin Urine: NEGATIVE
Glucose, UA: 500 mg/dL — AB
Ketones, ur: NEGATIVE mg/dL
Nitrite: NEGATIVE

## 2013-01-25 NOTE — Patient Instructions (Signed)
Contraception Choices Contraception (birth control) is the use of any methods or devices to prevent pregnancy. Below are some methods to help avoid pregnancy. HORMONAL METHODS   Contraceptive implant This is a thin, plastic tube containing progesterone hormone. It does not contain estrogen hormone. Your health care provider inserts the tube in the inner part of the upper arm. The tube can remain in place for up to 3 years. After 3 years, the implant must be removed. The implant prevents the ovaries from releasing an egg (ovulation), thickens the cervical mucus to prevent sperm from entering the uterus, and thins the lining of the inside of the uterus.  Progesterone-only injections These injections are given every 3 months by your health care provider to prevent pregnancy. This synthetic progesterone hormone stops the ovaries from releasing eggs. It also thickens cervical mucus and changes the uterine lining. This makes it harder for sperm to survive in the uterus.  Birth control pills These pills contain estrogen and progesterone hormone. They work by preventing the ovaries from releasing eggs (ovulation). They also cause the cervical mucus to thicken, preventing the sperm from entering the uterus. Birth control pills are prescribed by a health care provider.Birth control pills can also be used to treat heavy periods.  Minipill This type of birth control pill contains only the progesterone hormone. They are taken every day of each month and must be prescribed by your health care provider.  Birth control patch The patch contains hormones similar to those in birth control pills. It must be changed once a week and is prescribed by a health care provider.  Vaginal ring The ring contains hormones similar to those in birth control pills. It is left in the vagina for 3 weeks, removed for 1 week, and then a new one is put back in place. The patient must be comfortable inserting and removing the ring from the  vagina.A health care provider's prescription is necessary.  Emergency contraception Emergency contraceptives prevent pregnancy after unprotected sexual intercourse. This pill can be taken right after sex or up to 5 days after unprotected sex. It is most effective the sooner you take the pills after having sexual intercourse. Most emergency contraceptive pills are available without a prescription. Check with your pharmacist. Do not use emergency contraception as your only form of birth control. BARRIER METHODS   Female condom This is a thin sheath (latex or rubber) that is worn over the penis during sexual intercourse. It can be used with spermicide to increase effectiveness.  Female condom. This is a soft, loose-fitting sheath that is put into the vagina before sexual intercourse.  Diaphragm This is a soft, latex, dome-shaped barrier that must be fitted by a health care provider. It is inserted into the vagina, along with a spermicidal jelly. It is inserted before intercourse. The diaphragm should be left in the vagina for 6 to 8 hours after intercourse.  Cervical cap This is a round, soft, latex or plastic cup that fits over the cervix and must be fitted by a health care provider. The cap can be left in place for up to 48 hours after intercourse.  Sponge This is a soft, circular piece of polyurethane foam. The sponge has spermicide in it. It is inserted into the vagina after wetting it and before sexual intercourse.  Spermicides These are chemicals that kill or block sperm from entering the cervix and uterus. They come in the form of creams, jellies, suppositories, foam, or tablets. They do not require a   prescription. They are inserted into the vagina with an applicator before having sexual intercourse. The process must be repeated every time you have sexual intercourse. INTRAUTERINE CONTRACEPTION  Intrauterine device (IUD) This is a T-shaped device that is put in a woman's uterus during a  menstrual period to prevent pregnancy. There are 2 types:  Copper IUD This type of IUD is wrapped in copper wire and is placed inside the uterus. Copper makes the uterus and fallopian tubes produce a fluid that kills sperm. It can stay in place for 10 years.  Hormone IUD This type of IUD contains the hormone progestin (synthetic progesterone). The hormone thickens the cervical mucus and prevents sperm from entering the uterus, and it also thins the uterine lining to prevent implantation of a fertilized egg. The hormone can weaken or kill the sperm that get into the uterus. It can stay in place for 3 5 years, depending on which type of IUD is used. PERMANENT METHODS OF CONTRACEPTION  Female tubal ligation This is when the woman's fallopian tubes are surgically sealed, tied, or blocked to prevent the egg from traveling to the uterus.  Hysteroscopic sterilization This involves placing a small coil or insert into each fallopian tube. Your doctor uses a technique called hysteroscopy to do the procedure. The device causes scar tissue to form. This results in permanent blockage of the fallopian tubes, so the sperm cannot fertilize the egg. It takes about 3 months after the procedure for the tubes to become blocked. You must use another form of birth control for these 3 months.  Female sterilization This is when the female has the tubes that carry sperm tied off (vasectomy).This blocks sperm from entering the vagina during sexual intercourse. After the procedure, the man can still ejaculate fluid (semen). NATURAL PLANNING METHODS  Natural family planning This is not having sexual intercourse or using a barrier method (condom, diaphragm, cervical cap) on days the woman could become pregnant.  Calendar method This is keeping track of the length of each menstrual cycle and identifying when you are fertile.  Ovulation method This is avoiding sexual intercourse during ovulation.  Symptothermal method This is  avoiding sexual intercourse during ovulation, using a thermometer and ovulation symptoms.  Post ovulation method This is timing sexual intercourse after you have ovulated. Regardless of which type or method of contraception you choose, it is important that you use condoms to protect against the transmission of sexually transmitted infections (STIs). Talk with your health care provider about which form of contraception is most appropriate for you. Document Released: 02/02/2005 Document Revised: 10/05/2012 Document Reviewed: 07/28/2012 [employer] Patient Information 2014 [employer].  

## 2013-01-25 NOTE — Progress Notes (Signed)
Pulse: 81

## 2013-01-25 NOTE — Progress Notes (Signed)
Doing well. Finished finals [employer]. Plans reviewed. Encouraged classes.

## 2013-02-08 ENCOUNTER — Encounter: Payer: Self-pay | Admitting: Obstetrics and Gynecology

## 2013-02-08 ENCOUNTER — Ambulatory Visit (INDEPENDENT_AMBULATORY_CARE_PROVIDER_SITE_OTHER): Payer: No Typology Code available for payment source | Admitting: Obstetrics and Gynecology

## 2013-02-08 VITALS — BP 108/72 | Temp 97.6°F | Wt 144.7 lb

## 2013-02-08 DIAGNOSIS — O0933 Supervision of pregnancy with insufficient antenatal care, third trimester: Secondary | ICD-10-CM

## 2013-02-08 DIAGNOSIS — O350XX0 Maternal care for (suspected) central nervous system malformation in fetus, not applicable or unspecified: Secondary | ICD-10-CM

## 2013-02-08 DIAGNOSIS — Z3490 Encounter for supervision of normal pregnancy, unspecified, unspecified trimester: Secondary | ICD-10-CM

## 2013-02-08 DIAGNOSIS — O0930 Supervision of pregnancy with insufficient antenatal care, unspecified trimester: Secondary | ICD-10-CM

## 2013-02-08 LAB — POCT URINALYSIS DIP (DEVICE)
Bilirubin Urine: NEGATIVE
Glucose, UA: NEGATIVE mg/dL
Ketones, ur: NEGATIVE mg/dL
Leukocytes, UA: NEGATIVE
Protein, ur: NEGATIVE mg/dL
Specific Gravity, Urine: 1.025 (ref 1.005–1.030)

## 2013-02-08 NOTE — Progress Notes (Signed)
p=104 

## 2013-02-08 NOTE — Progress Notes (Signed)
Patient doing well without complaints. FM/PTL precautions reviewed. Cultures next visit

## 2013-02-15 ENCOUNTER — Ambulatory Visit (INDEPENDENT_AMBULATORY_CARE_PROVIDER_SITE_OTHER): Payer: No Typology Code available for payment source | Admitting: Obstetrics & Gynecology

## 2013-02-15 ENCOUNTER — Encounter: Payer: Self-pay | Admitting: Obstetrics & Gynecology

## 2013-02-15 VITALS — BP 108/69 | Temp 97.7°F | Wt 146.3 lb

## 2013-02-15 DIAGNOSIS — O0930 Supervision of pregnancy with insufficient antenatal care, unspecified trimester: Secondary | ICD-10-CM

## 2013-02-15 DIAGNOSIS — Z3483 Encounter for supervision of other normal pregnancy, third trimester: Secondary | ICD-10-CM

## 2013-02-15 LAB — POCT URINALYSIS DIP (DEVICE)
Glucose, UA: NEGATIVE mg/dL
Hgb urine dipstick: NEGATIVE
Leukocytes, UA: NEGATIVE
Nitrite: NEGATIVE
Urobilinogen, UA: 0.2 mg/dL (ref 0.0–1.0)

## 2013-02-15 NOTE — Progress Notes (Signed)
Pt with no problems.  Does not want to use anything for contraception for cultural reasons' GBS and cx done today Labor precautions reviewed

## 2013-02-15 NOTE — Progress Notes (Signed)
Pulse-  95 

## 2013-02-15 NOTE — Patient Instructions (Signed)
Contraception Choices Contraception (birth control) is the use of any methods or devices to prevent pregnancy. Below are some methods to help avoid pregnancy. HORMONAL METHODS   Contraceptive implant This is a thin, plastic tube containing progesterone hormone. It does not contain estrogen hormone. Your health care provider inserts the tube in the inner part of the upper arm. The tube can remain in place for up to 3 years. After 3 years, the implant must be removed. The implant prevents the ovaries from releasing an egg (ovulation), thickens the cervical mucus to prevent sperm from entering the uterus, and thins the lining of the inside of the uterus.  Progesterone-only injections These injections are given every 3 months by your health care provider to prevent pregnancy. This synthetic progesterone hormone stops the ovaries from releasing eggs. It also thickens cervical mucus and changes the uterine lining. This makes it harder for sperm to survive in the uterus.  Birth control pills These pills contain estrogen and progesterone hormone. They work by preventing the ovaries from releasing eggs (ovulation). They also cause the cervical mucus to thicken, preventing the sperm from entering the uterus. Birth control pills are prescribed by a health care provider.Birth control pills can also be used to treat heavy periods.  Minipill This type of birth control pill contains only the progesterone hormone. They are taken every day of each month and must be prescribed by your health care provider.  Birth control patch The patch contains hormones similar to those in birth control pills. It must be changed once a week and is prescribed by a health care provider.  Vaginal ring The ring contains hormones similar to those in birth control pills. It is left in the vagina for 3 weeks, removed for 1 week, and then a new one is put back in place. The patient must be comfortable inserting and removing the ring from  the vagina.A health care provider's prescription is necessary.  Emergency contraception Emergency contraceptives prevent pregnancy after unprotected sexual intercourse. This pill can be taken right after sex or up to 5 days after unprotected sex. It is most effective the sooner you take the pills after having sexual intercourse. Most emergency contraceptive pills are available without a prescription. Check with your pharmacist. Do not use emergency contraception as your only form of birth control. BARRIER METHODS   Female condom This is a thin sheath (latex or rubber) that is worn over the penis during sexual intercourse. It can be used with spermicide to increase effectiveness.  Female condom. This is a soft, loose-fitting sheath that is put into the vagina before sexual intercourse.  Diaphragm This is a soft, latex, dome-shaped barrier that must be fitted by a health care provider. It is inserted into the vagina, along with a spermicidal jelly. It is inserted before intercourse. The diaphragm should be left in the vagina for 6 to 8 hours after intercourse.  Cervical cap This is a round, soft, latex or plastic cup that fits over the cervix and must be fitted by a health care provider. The cap can be left in place for up to 48 hours after intercourse.  Sponge This is a soft, circular piece of polyurethane foam. The sponge has spermicide in it. It is inserted into the vagina after wetting it and before sexual intercourse.  Spermicides These are chemicals that kill or block sperm from entering the cervix and uterus. They come in the form of creams, jellies, suppositories, foam, or tablets. They do not require a   prescription. They are inserted into the vagina with an applicator before having sexual intercourse. The process must be repeated every time you have sexual intercourse. INTRAUTERINE CONTRACEPTION  Intrauterine device (IUD) This is a T-shaped device that is put in a woman's uterus during a  menstrual period to prevent pregnancy. There are 2 types:  Copper IUD This type of IUD is wrapped in copper wire and is placed inside the uterus. Copper makes the uterus and fallopian tubes produce a fluid that kills sperm. It can stay in place for 10 years.  Hormone IUD This type of IUD contains the hormone progestin (synthetic progesterone). The hormone thickens the cervical mucus and prevents sperm from entering the uterus, and it also thins the uterine lining to prevent implantation of a fertilized egg. The hormone can weaken or kill the sperm that get into the uterus. It can stay in place for 3 5 years, depending on which type of IUD is used. PERMANENT METHODS OF CONTRACEPTION  Female tubal ligation This is when the woman's fallopian tubes are surgically sealed, tied, or blocked to prevent the egg from traveling to the uterus.  Hysteroscopic sterilization This involves placing a small coil or insert into each fallopian tube. Your doctor uses a technique called hysteroscopy to do the procedure. The device causes scar tissue to form. This results in permanent blockage of the fallopian tubes, so the sperm cannot fertilize the egg. It takes about 3 months after the procedure for the tubes to become blocked. You must use another form of birth control for these 3 months.  Female sterilization This is when the female has the tubes that carry sperm tied off (vasectomy).This blocks sperm from entering the vagina during sexual intercourse. After the procedure, the man can still ejaculate fluid (semen). NATURAL PLANNING METHODS  Natural family planning This is not having sexual intercourse or using a barrier method (condom, diaphragm, cervical cap) on days the woman could become pregnant.  Calendar method This is keeping track of the length of each menstrual cycle and identifying when you are fertile.  Ovulation method This is avoiding sexual intercourse during ovulation.  Symptothermal method This is  avoiding sexual intercourse during ovulation, using a thermometer and ovulation symptoms.  Post ovulation method This is timing sexual intercourse after you have ovulated. Regardless of which type or method of contraception you choose, it is important that you use condoms to protect against the transmission of sexually transmitted infections (STIs). Talk with your health care provider about which form of contraception is most appropriate for you. Document Released: 02/02/2005 Document Revised: 10/05/2012 Document Reviewed: 07/28/2012 [employer] Patient Information 2014 [employer].  Breastfeeding Deciding to breastfeed is one of the best choices you can make for you and your baby. A change in hormones during pregnancy causes your breast tissue to grow and increases the number and size of your milk ducts. These hormones also allow proteins, sugars, and fats from your blood supply to make breast milk in your milk-producing glands. Hormones prevent breast milk from being released before your baby is born as well as prompt milk flow after birth. Once breastfeeding has begun, thoughts of your baby, as well as his or her sucking or crying, can stimulate the release of milk from your milk-producing glands.  BENEFITS OF BREASTFEEDING For Your Baby  Your first milk (colostrum) helps your baby's digestive system function better.   There are antibodies in your milk that help your baby fight off infections.   Your baby has a   lower incidence of asthma, allergies, and sudden infant death syndrome.   The nutrients in breast milk are better for your baby than infant formulas and are designed uniquely for your baby's needs.   Breast milk improves your baby's brain development.   Your baby is less likely to develop other conditions, such as childhood obesity, asthma, or type 2 diabetes mellitus.  For You   Breastfeeding helps to create a very special bond between you and your baby.   Breastfeeding  is convenient. Breast milk is always available at the correct temperature and costs nothing.   Breastfeeding helps to burn calories and helps you lose the weight gained during pregnancy.   Breastfeeding makes your uterus contract to its prepregnancy size faster and slows bleeding (lochia) after you give birth.   Breastfeeding helps to lower your risk of developing type 2 diabetes mellitus, osteoporosis, and breast or ovarian cancer later in life. SIGNS THAT YOUR BABY IS HUNGRY Early Signs of Hunger  Increased alertness or activity.  Stretching.  Movement of the head from side to side.  Movement of the head and opening of the mouth when the corner of the mouth or cheek is stroked (rooting).  Increased sucking sounds, smacking lips, cooing, sighing, or squeaking.  Hand-to-mouth movements.  Increased sucking of fingers or hands. Late Signs of Hunger  Fussing.  Intermittent crying. Extreme Signs of Hunger Signs of extreme hunger will require calming and consoling before your baby will be able to breastfeed successfully. Do not wait for the following signs of extreme hunger to occur before you initiate breastfeeding:   Restlessness.  A loud, strong cry.   Screaming. BREASTFEEDING BASICS Breastfeeding Initiation  Find a comfortable place to sit or lie down, with your neck and back well supported.  Place a pillow or rolled up blanket under your baby to bring him or her to the level of your breast (if you are seated). Nursing pillows are specially designed to help support your arms and your baby while you breastfeed.  Make sure that your baby's abdomen is facing your abdomen.   Gently massage your breast. With your fingertips, massage from your chest wall toward your nipple in a circular motion. This encourages milk flow. You may need to continue this action during the feeding if your milk flows slowly.  Support your breast with 4 fingers underneath and your thumb above  your nipple. Make sure your fingers are well away from your nipple and your baby's mouth.   Stroke your baby's lips gently with your finger or nipple.   When your baby's mouth is open wide enough, quickly bring your baby to your breast, placing your entire nipple and as much of the colored area around your nipple (areola) as possible into your baby's mouth.   More areola should be visible above your baby's upper lip than below the lower lip.   Your baby's tongue should be between his or her lower gum and your breast.   Ensure that your baby's mouth is correctly positioned around your nipple (latched). Your baby's lips should create a seal on your breast and be turned out (everted).  It is common for your baby to suck about 2 3 minutes in order to start the flow of breast milk. Latching Teaching your baby how to latch on to your breast properly is very important. An improper latch can cause nipple pain and decreased milk supply for you and poor weight gain in your baby. Also, if your baby is   not latched onto your nipple properly, he or she may swallow some air during feeding. This can make your baby fussy. Burping your baby when you switch breasts during the feeding can help to get rid of the air. However, teaching your baby to latch on properly is still the best way to prevent fussiness from swallowing air while breastfeeding. Signs that your baby has successfully latched on to your nipple:    Silent tugging or silent sucking, without causing you pain.   Swallowing heard between every 3 4 sucks.    Muscle movement above and in front of his or her ears while sucking.  Signs that your baby has not successfully latched on to nipple:   Sucking sounds or smacking sounds from your baby while breastfeeding.  Nipple pain. If you think your baby has not latched on correctly, slip your finger into the corner of your baby's mouth to break the suction and place it between your baby's gums.  Attempt breastfeeding initiation again. Signs of Successful Breastfeeding Signs from your baby:   A gradual decrease in the number of sucks or complete cessation of sucking.   Falling asleep.   Relaxation of his or her body.   Retention of a small amount of milk in his or her mouth.   Letting go of your breast by himself or herself. Signs from you:  Breasts that have increased in firmness, weight, and size 1 3 hours after feeding.   Breasts that are softer immediately after breastfeeding.  Increased milk volume, as well as a change in milk consistency and color by the 5th day of breastfeeding.   Nipples that are not sore, cracked, or bleeding. Signs That Your Baby is Getting Enough Milk  Wetting at least 3 diapers in a 24-hour period. The urine should be clear and pale yellow by age 5 days.  At least 3 stools in a 24-hour period by age 5 days. The stool should be soft and yellow.  At least 3 stools in a 24-hour period by age 7 days. The stool should be seedy and yellow.  No loss of weight greater than 10% of birth weight during the first 3 days of age.  Average weight gain of 4 7 ounces (120 210 mL) per week after age 4 days.  Consistent daily weight gain by age 5 days, without weight loss after the age of 2 weeks. After a feeding, your baby may spit up a small amount. This is common. BREASTFEEDING FREQUENCY AND DURATION Frequent feeding will help you make more milk and can prevent sore nipples and breast engorgement. Breastfeed when you feel the need to reduce the fullness of your breasts or when your baby shows signs of hunger. This is called "breastfeeding on demand." Avoid introducing a pacifier to your baby while you are working to establish breastfeeding (the first 4 6 weeks after your baby is born). After this time you may choose to use a pacifier. Research has shown that pacifier use during the first year of a baby's life decreases the risk of sudden infant death  syndrome (SIDS). Allow your baby to feed on each breast as long as he or she wants. Breastfeed until your baby is finished feeding. When your baby unlatches or falls asleep while feeding from the first breast, offer the second breast. Because newborns are often sleepy in the first few weeks of life, you may need to awaken your baby to get him or her to feed. Breastfeeding times will vary from   baby to baby. However, the following rules can serve as a guide to help you ensure that your baby is properly fed:  Newborns (babies 4 weeks of age or younger) may breastfeed every 1 3 hours.  Newborns should not go longer than 3 hours during the day or 5 hours during the night without breastfeeding.  You should breastfeed your baby a minimum of 8 times in a 24-hour period until you begin to introduce solid foods to your baby at around 6 months of age. BREAST MILK PUMPING Pumping and storing breast milk allows you to ensure that your baby is exclusively fed your breast milk, even at times when you are unable to breastfeed. This is especially important if you are going back to work while you are still breastfeeding or when you are not able to be present during feedings. Your lactation consultant can give you guidelines on how long it is safe to store breast milk.  A breast pump is a machine that allows you to pump milk from your breast into a sterile bottle. The pumped breast milk can then be stored in a refrigerator or freezer. Some breast pumps are operated by hand, while others use electricity. Ask your lactation consultant which type will work best for you. Breast pumps can be purchased, but some hospitals and breastfeeding support groups lease breast pumps on a monthly basis. A lactation consultant can teach you how to hand express breast milk, if you prefer not to use a pump.  CARING FOR YOUR BREASTS WHILE YOU BREASTFEED Nipples can become dry, cracked, and sore while breastfeeding. The following  recommendations can help keep your breasts moisturized and healthy:  Avoid using soap on your nipples.   Wear a supportive bra. Although not required, special nursing bras and tank tops are designed to allow access to your breasts for breastfeeding without taking off your entire bra or top. Avoid wearing underwire style bras or extremely tight bras.  Air dry your nipples for 3 4minutes after each feeding.   Use only cotton bra pads to absorb leaked breast milk. Leaking of breast milk between feedings is normal.   Use lanolin on your nipples after breastfeeding. Lanolin helps to maintain your skin's normal moisture barrier. If you use pure lanolin you do not need to wash it off before feeding your baby again. Pure lanolin is not toxic to your baby. You may also hand express a few drops of breast milk and gently massage that milk into your nipples and allow the milk to air dry. In the first few weeks after giving birth, some women experience extremely full breasts (engorgement). Engorgement can make your breasts feel heavy, warm, and tender to the touch. Engorgement peaks within 3 5 days after you give birth. The following recommendations can help ease engorgement:  Completely empty your breasts while breastfeeding or pumping. You may want to start by applying warm, moist heat (in the shower or with warm water-soaked hand towels) just before feeding or pumping. This increases circulation and helps the milk flow. If your baby does not completely empty your breasts while breastfeeding, pump any extra milk after he or she is finished.  Wear a snug bra (nursing or regular) or tank top for 1 2 days to signal your body to slightly decrease milk production.  Apply ice packs to your breasts, unless this is too uncomfortable for you.  Make sure that your baby is latched on and positioned properly while breastfeeding. If engorgement persists after 48 hours   of following these recommendations, contact your  health care provider or a lactation consultant. OVERALL HEALTH CARE RECOMMENDATIONS WHILE BREASTFEEDING  Eat healthy foods. Alternate between meals and snacks, eating 3 of each per day. Because what you eat affects your breast milk, some of the foods may make your baby more irritable than usual. Avoid eating these foods if you are sure that they are negatively affecting your baby.  Drink milk, fruit juice, and water to satisfy your thirst (about 10 glasses a day).   Rest often, relax, and continue to take your prenatal vitamins to prevent fatigue, stress, and anemia.  Continue breast self-awareness checks.  Avoid chewing and smoking tobacco.  Avoid alcohol and drug use. Some medicines that may be harmful to your baby can pass through breast milk. It is important to ask your health care provider before taking any medicine, including all over-the-counter and prescription medicine as well as vitamin and herbal supplements. It is possible to become pregnant while breastfeeding. If birth control is desired, ask your health care provider about options that will be safe for your baby. SEEK MEDICAL CARE IF:   You feel like you want to stop breastfeeding or have become frustrated with breastfeeding.  You have painful breasts or nipples.  Your nipples are cracked or bleeding.  Your breasts are red, tender, or warm.  You have a swollen area on either breast.  You have a fever or chills.  You have nausea or vomiting.  You have drainage other than breast milk from your nipples.  Your breasts do not become full before feedings by the 5th day after you give birth.  You feel sad and depressed.  Your baby is too sleepy to eat well.  Your baby is having trouble sleeping.   Your baby is wetting less than 3 diapers in a 24-hour period.  Your baby has less than 3 stools in a 24-hour period.  Your baby's skin or the white part of his or her eyes becomes yellow.   Your baby is not gaining  weight by 5 days of age. SEEK IMMEDIATE MEDICAL CARE IF:   Your baby is overly tired (lethargic) and does not want to wake up and feed.  Your baby develops an unexplained fever. Document Released: 02/02/2005 Document Revised: 10/05/2012 Document Reviewed: 07/27/2012 [employer] Patient Information 2014 [employer].  

## 2013-02-16 LAB — GC/CHLAMYDIA PROBE AMP
CT Probe RNA: NEGATIVE
GC Probe RNA: NEGATIVE

## 2013-02-16 NOTE — L&D Delivery Note (Signed)
Attestation of Attending Supervision of Advanced Practitioner (CNM/NP): Evaluation and management procedures were performed by the Advanced Practitioner under my supervision and collaboration.  I have reviewed the Advanced Practitioner's note and chart, and I agree with the management and plan.  Lilliona Blakeney 04/06/2013 9:09 AM

## 2013-02-16 NOTE — L&D Delivery Note (Signed)
Delivery Note At 10:49 PM a viable female was delivered via Vaginal, Spontaneous Delivery (Presentation: Occiput Anterior).  APGAR: 8, 9; weight pending .   Placenta status: Intact, Spontaneous.  Cord: 3 vessels with the following complications: Nuchal x 1.  Cord pH: not sent.  Infant placed directly on mom's chest.  FOB cut the cord.  Parents and infant bonding.  Grandmother also present.  Anesthesia: None  Episiotomy: None Lacerations: 2nd degree Suture Repair: 3.0 vicryl Est. Blood Loss (mL): 400  Mom to postpartum.  Baby to Couplet care / Skin to Skin.  Selena LesserBraimah, Ramal Eckhardt 04/02/2013, 11:07 PM

## 2013-02-20 ENCOUNTER — Encounter: Payer: Self-pay | Admitting: Obstetrics & Gynecology

## 2013-02-20 LAB — CULTURE, BETA STREP (GROUP B ONLY)

## 2013-02-23 ENCOUNTER — Ambulatory Visit (INDEPENDENT_AMBULATORY_CARE_PROVIDER_SITE_OTHER): Payer: No Typology Code available for payment source | Admitting: Obstetrics & Gynecology

## 2013-02-23 VITALS — BP 107/67 | Temp 97.6°F | Wt 148.4 lb

## 2013-02-23 DIAGNOSIS — IMO0002 Reserved for concepts with insufficient information to code with codable children: Secondary | ICD-10-CM

## 2013-02-23 DIAGNOSIS — O350XX0 Maternal care for (suspected) central nervous system malformation in fetus, not applicable or unspecified: Secondary | ICD-10-CM

## 2013-02-23 DIAGNOSIS — O0930 Supervision of pregnancy with insufficient antenatal care, unspecified trimester: Secondary | ICD-10-CM

## 2013-02-23 DIAGNOSIS — O289 Unspecified abnormal findings on antenatal screening of mother: Secondary | ICD-10-CM

## 2013-02-23 DIAGNOSIS — O0933 Supervision of pregnancy with insufficient antenatal care, third trimester: Secondary | ICD-10-CM

## 2013-02-23 LAB — POCT URINALYSIS DIP (DEVICE)
Bilirubin Urine: NEGATIVE
GLUCOSE, UA: NEGATIVE mg/dL
HGB URINE DIPSTICK: NEGATIVE
KETONES UR: NEGATIVE mg/dL
Nitrite: NEGATIVE
Protein, ur: NEGATIVE mg/dL
SPECIFIC GRAVITY, URINE: 1.025 (ref 1.005–1.030)
UROBILINOGEN UA: 0.2 mg/dL (ref 0.0–1.0)
pH: 6 (ref 5.0–8.0)

## 2013-02-23 NOTE — Progress Notes (Signed)
P-96 

## 2013-02-23 NOTE — Patient Instructions (Signed)
Return to clinic for any obstetric concerns or go to [facility] for evaluation  

## 2013-02-23 NOTE — Progress Notes (Signed)
GBS negative.  Declines cervical exam.   No other complaints or concerns.  Fetal movement and labor precautions reviewed.

## 2013-03-02 ENCOUNTER — Ambulatory Visit (INDEPENDENT_AMBULATORY_CARE_PROVIDER_SITE_OTHER): Payer: No Typology Code available for payment source | Admitting: Obstetrics & Gynecology

## 2013-03-02 VITALS — BP 117/72 | Temp 97.1°F | Wt 152.0 lb

## 2013-03-02 DIAGNOSIS — O0930 Supervision of pregnancy with insufficient antenatal care, unspecified trimester: Secondary | ICD-10-CM

## 2013-03-02 LAB — POCT URINALYSIS DIP (DEVICE)
BILIRUBIN URINE: NEGATIVE
Glucose, UA: NEGATIVE mg/dL
HGB URINE DIPSTICK: NEGATIVE
Ketones, ur: NEGATIVE mg/dL
NITRITE: NEGATIVE
PH: 6 (ref 5.0–8.0)
Protein, ur: NEGATIVE mg/dL
SPECIFIC GRAVITY, URINE: 1.025 (ref 1.005–1.030)
Urobilinogen, UA: 0.2 mg/dL (ref 0.0–1.0)

## 2013-03-02 NOTE — Progress Notes (Signed)
Pulse: 91

## 2013-03-02 NOTE — Progress Notes (Signed)
No complaints.  Watch fundal height.  Get US if still 35 cm at next visit.

## 2013-03-07 ENCOUNTER — Ambulatory Visit (INDEPENDENT_AMBULATORY_CARE_PROVIDER_SITE_OTHER): Payer: No Typology Code available for payment source | Admitting: Obstetrics and Gynecology

## 2013-03-07 VITALS — BP 115/64 | Temp 98.4°F | Wt 150.6 lb

## 2013-03-07 DIAGNOSIS — O99810 Abnormal glucose complicating pregnancy: Secondary | ICD-10-CM

## 2013-03-07 DIAGNOSIS — O0930 Supervision of pregnancy with insufficient antenatal care, unspecified trimester: Secondary | ICD-10-CM

## 2013-03-07 DIAGNOSIS — R7309 Other abnormal glucose: Secondary | ICD-10-CM

## 2013-03-07 LAB — POCT URINALYSIS DIP (DEVICE)
BILIRUBIN URINE: NEGATIVE
Glucose, UA: NEGATIVE mg/dL
HGB URINE DIPSTICK: NEGATIVE
KETONES UR: NEGATIVE mg/dL
LEUKOCYTES UA: NEGATIVE
Nitrite: NEGATIVE
PH: 6 (ref 5.0–8.0)
PROTEIN: NEGATIVE mg/dL
SPECIFIC GRAVITY, URINE: 1.025 (ref 1.005–1.030)
Urobilinogen, UA: 0.2 mg/dL (ref 0.0–1.0)

## 2013-03-07 NOTE — Progress Notes (Signed)
P-84  

## 2013-03-07 NOTE — Progress Notes (Signed)
Evaluation and management procedures were performed by SNM under my supervision/collaboration. Chart reviewed, patient examined by me and I agree with management and plan. 

## 2013-03-07 NOTE — Progress Notes (Signed)
No complaints today.  Fundal height less than expected but following trend.  Denies VB, LOF, contractions.

## 2013-03-07 NOTE — Patient Instructions (Signed)
Third Trimester of Pregnancy  The third trimester is from week 29 through week 42, months 7 through 9. The third trimester is a time when the fetus is growing rapidly. At the end of the ninth month, the fetus is about 20 inches in length and weighs 6 10 pounds.   BODY CHANGES  Your body goes through many changes during pregnancy. The changes vary from woman to woman.    Your weight will continue to increase. You can expect to gain 25 35 pounds (11 16 kg) by the end of the pregnancy.   You may begin to get stretch marks on your hips, abdomen, and breasts.   You may urinate more often because the fetus is moving lower into your pelvis and pressing on your bladder.   You may develop or continue to have heartburn as a result of your pregnancy.   You may develop constipation because certain hormones are causing the muscles that push waste through your intestines to slow down.   You may develop hemorrhoids or swollen, bulging veins (varicose veins).   You may have pelvic pain because of the weight gain and pregnancy hormones relaxing your joints between the bones in your pelvis. Back aches may result from over exertion of the muscles supporting your posture.   Your breasts will continue to grow and be tender. A yellow discharge may leak from your breasts called colostrum.   Your belly button may stick out.   You may feel short of breath because of your expanding uterus.   You may notice the fetus "dropping," or moving lower in your abdomen.   You may have a bloody mucus discharge. This usually occurs a few days to a week before labor begins.   Your cervix becomes thin and soft (effaced) near your due date.  WHAT TO EXPECT AT YOUR PRENATAL EXAMS   You will have prenatal exams every 2 weeks until week 36. Then, you will have weekly prenatal exams. During a routine prenatal visit:   You will be weighed to make sure you and the fetus are growing normally.   Your blood pressure is taken.   Your abdomen will be  measured to track your baby's growth.   The fetal heartbeat will be listened to.   Any test results from the previous visit will be discussed.   You may have a cervical check near your due date to see if you have effaced.  At around 36 weeks, your caregiver will check your cervix. At the same time, your caregiver will also perform a test on the secretions of the vaginal tissue. This test is to determine if a type of bacteria, Group B streptococcus, is present. Your caregiver will explain this further.  Your caregiver may ask you:   What your birth plan is.   How you are feeling.   If you are feeling the baby move.   If you have had any abnormal symptoms, such as leaking fluid, bleeding, severe headaches, or abdominal cramping.   If you have any questions.  Other tests or screenings that may be performed during your third trimester include:   Blood tests that check for low iron levels (anemia).   Fetal testing to check the health, activity level, and growth of the fetus. Testing is done if you have certain medical conditions or if there are problems during the pregnancy.  FALSE LABOR  You may feel small, irregular contractions that eventually go away. These are called Braxton Hicks contractions, or   false labor. Contractions may last for hours, days, or even weeks before true labor sets in. If contractions come at regular intervals, intensify, or become painful, it is best to be seen by your caregiver.   SIGNS OF LABOR    Menstrual-like cramps.   Contractions that are 5 minutes apart or less.   Contractions that start on the top of the uterus and spread down to the lower abdomen and back.   A sense of increased pelvic pressure or back pain.   A watery or bloody mucus discharge that comes from the vagina.  If you have any of these signs before the 37th week of pregnancy, call your caregiver right away. You need to go to the hospital to get checked immediately.  HOME CARE INSTRUCTIONS    Avoid all  smoking, herbs, alcohol, and unprescribed drugs. These chemicals affect the formation and growth of the baby.   Follow your caregiver's instructions regarding medicine use. There are medicines that are either safe or unsafe to take during pregnancy.   Exercise only as directed by your caregiver. Experiencing uterine cramps is a good sign to stop exercising.   Continue to eat regular, healthy meals.   Wear a good support bra for breast tenderness.   Do not use hot tubs, steam rooms, or saunas.   Wear your seat belt at all times when driving.   Avoid raw meat, uncooked cheese, cat litter boxes, and soil used by cats. These carry germs that can cause birth defects in the baby.   Take your prenatal vitamins.   Try taking a stool softener (if your caregiver approves) if you develop constipation. Eat more high-fiber foods, such as fresh vegetables or fruit and whole grains. Drink plenty of fluids to keep your urine clear or pale yellow.   Take warm sitz baths to soothe any pain or discomfort caused by hemorrhoids. Use hemorrhoid cream if your caregiver approves.   If you develop varicose veins, wear support hose. Elevate your feet for 15 minutes, 3 4 times a day. Limit salt in your diet.   Avoid heavy lifting, wear low heal shoes, and practice good posture.   Rest a lot with your legs elevated if you have leg cramps or low back pain.   Visit your dentist if you have not gone during your pregnancy. Use a soft toothbrush to brush your teeth and be gentle when you floss.   A sexual relationship may be continued unless your caregiver directs you otherwise.   Do not travel far distances unless it is absolutely necessary and only with the approval of your caregiver.   Take prenatal classes to understand, practice, and ask questions about the labor and delivery.   Make a trial run to the hospital.   Pack your hospital bag.   Prepare the baby's nursery.   Continue to go to all your prenatal visits as directed  by your caregiver.  SEEK MEDICAL CARE IF:   You are unsure if you are in labor or if your water has broken.   You have dizziness.   You have mild pelvic cramps, pelvic pressure, or nagging pain in your abdominal area.   You have persistent nausea, vomiting, or diarrhea.   You have a bad smelling vaginal discharge.   You have pain with urination.  SEEK IMMEDIATE MEDICAL CARE IF:    You have a fever.   You are leaking fluid from your vagina.   You have spotting or bleeding from your vagina.     You have severe abdominal cramping or pain.   You have rapid weight loss or gain.   You have shortness of breath with chest pain.   You notice sudden or extreme swelling of your face, hands, ankles, feet, or legs.   You have not felt your baby move in over an hour.   You have severe headaches that do not go away with medicine.   You have vision changes.  Document Released: 01/27/2001 Document Revised: 10/05/2012 Document Reviewed: 04/05/2012  Exit[employer] Patient Information 2014 [employer].

## 2013-03-14 ENCOUNTER — Ambulatory Visit (INDEPENDENT_AMBULATORY_CARE_PROVIDER_SITE_OTHER): Payer: No Typology Code available for payment source | Admitting: Family

## 2013-03-14 VITALS — BP 117/72 | Wt 152.0 lb

## 2013-03-14 DIAGNOSIS — O0930 Supervision of pregnancy with insufficient antenatal care, unspecified trimester: Secondary | ICD-10-CM

## 2013-03-14 LAB — POCT URINALYSIS DIP (DEVICE)
Bilirubin Urine: NEGATIVE
Glucose, UA: NEGATIVE mg/dL
HGB URINE DIPSTICK: NEGATIVE
Nitrite: NEGATIVE
PH: 7 (ref 5.0–8.0)
PROTEIN: NEGATIVE mg/dL
SPECIFIC GRAVITY, URINE: 1.015 (ref 1.005–1.030)
UROBILINOGEN UA: 1 mg/dL (ref 0.0–1.0)

## 2013-03-14 NOTE — Progress Notes (Signed)
P = 92 

## 2013-03-14 NOTE — Progress Notes (Signed)
Reviewed signs of labor.  Discussed IOL after 41 wks.

## 2013-03-21 ENCOUNTER — Encounter: Payer: Self-pay | Admitting: Family Medicine

## 2013-03-21 ENCOUNTER — Ambulatory Visit (INDEPENDENT_AMBULATORY_CARE_PROVIDER_SITE_OTHER): Payer: No Typology Code available for payment source | Admitting: Family Medicine

## 2013-03-21 VITALS — BP 116/77 | Wt 152.7 lb

## 2013-03-21 DIAGNOSIS — R7309 Other abnormal glucose: Secondary | ICD-10-CM

## 2013-03-21 DIAGNOSIS — O350XX0 Maternal care for (suspected) central nervous system malformation in fetus, not applicable or unspecified: Secondary | ICD-10-CM

## 2013-03-21 DIAGNOSIS — O289 Unspecified abnormal findings on antenatal screening of mother: Secondary | ICD-10-CM

## 2013-03-21 DIAGNOSIS — O0930 Supervision of pregnancy with insufficient antenatal care, unspecified trimester: Secondary | ICD-10-CM

## 2013-03-21 DIAGNOSIS — IMO0002 Reserved for concepts with insufficient information to code with codable children: Secondary | ICD-10-CM

## 2013-03-21 LAB — POCT URINALYSIS DIP (DEVICE)
Bilirubin Urine: NEGATIVE
Glucose, UA: NEGATIVE mg/dL
HGB URINE DIPSTICK: NEGATIVE
NITRITE: NEGATIVE
PH: 6.5 (ref 5.0–8.0)
PROTEIN: NEGATIVE mg/dL
SPECIFIC GRAVITY, URINE: 1.025 (ref 1.005–1.030)
UROBILINOGEN UA: 1 mg/dL (ref 0.0–1.0)

## 2013-03-21 NOTE — Patient Instructions (Signed)
Third Trimester of Pregnancy  The third trimester is from week 29 through week 42, months 7 through 9. The third trimester is a time when the fetus is growing rapidly. At the end of the ninth month, the fetus is about 20 inches in length and weighs 6 10 pounds.   BODY CHANGES  Your body goes through many changes during pregnancy. The changes vary from woman to woman.    Your weight will continue to increase. You can expect to gain 25 35 pounds (11 16 kg) by the end of the pregnancy.   You may begin to get stretch marks on your hips, abdomen, and breasts.   You may urinate more often because the fetus is moving lower into your pelvis and pressing on your bladder.   You may develop or continue to have heartburn as a result of your pregnancy.   You may develop constipation because certain hormones are causing the muscles that push waste through your intestines to slow down.   You may develop hemorrhoids or swollen, bulging veins (varicose veins).   You may have pelvic pain because of the weight gain and pregnancy hormones relaxing your joints between the bones in your pelvis. Back aches may result from over exertion of the muscles supporting your posture.   Your breasts will continue to grow and be tender. A yellow discharge may leak from your breasts called colostrum.   Your belly button may stick out.   You may feel short of breath because of your expanding uterus.   You may notice the fetus "dropping," or moving lower in your abdomen.   You may have a bloody mucus discharge. This usually occurs a few days to a week before labor begins.   Your cervix becomes thin and soft (effaced) near your due date.  WHAT TO EXPECT AT YOUR PRENATAL EXAMS   You will have prenatal exams every 2 weeks until week 36. Then, you will have weekly prenatal exams. During a routine prenatal visit:   You will be weighed to make sure you and the fetus are growing normally.   Your blood pressure is taken.   Your abdomen will be  measured to track your baby's growth.   The fetal heartbeat will be listened to.   Any test results from the previous visit will be discussed.   You may have a cervical check near your due date to see if you have effaced.  At around 36 weeks, your caregiver will check your cervix. At the same time, your caregiver will also perform a test on the secretions of the vaginal tissue. This test is to determine if a type of bacteria, Group B streptococcus, is present. Your caregiver will explain this further.  Your caregiver may ask you:   What your birth plan is.   How you are feeling.   If you are feeling the baby move.   If you have had any abnormal symptoms, such as leaking fluid, bleeding, severe headaches, or abdominal cramping.   If you have any questions.  Other tests or screenings that may be performed during your third trimester include:   Blood tests that check for low iron levels (anemia).   Fetal testing to check the health, activity level, and growth of the fetus. Testing is done if you have certain medical conditions or if there are problems during the pregnancy.  FALSE LABOR  You may feel small, irregular contractions that eventually go away. These are called Braxton Hicks contractions, or   false labor. Contractions may last for hours, days, or even weeks before true labor sets in. If contractions come at regular intervals, intensify, or become painful, it is best to be seen by your caregiver.   SIGNS OF LABOR    Menstrual-like cramps.   Contractions that are 5 minutes apart or less.   Contractions that start on the top of the uterus and spread down to the lower abdomen and back.   A sense of increased pelvic pressure or back pain.   A watery or bloody mucus discharge that comes from the vagina.  If you have any of these signs before the 37th week of pregnancy, call your caregiver right away. You need to go to the hospital to get checked immediately.  HOME CARE INSTRUCTIONS    Avoid all  smoking, herbs, alcohol, and unprescribed drugs. These chemicals affect the formation and growth of the baby.   Follow your caregiver's instructions regarding medicine use. There are medicines that are either safe or unsafe to take during pregnancy.   Exercise only as directed by your caregiver. Experiencing uterine cramps is a good sign to stop exercising.   Continue to eat regular, healthy meals.   Wear a good support bra for breast tenderness.   Do not use hot tubs, steam rooms, or saunas.   Wear your seat belt at all times when driving.   Avoid raw meat, uncooked cheese, cat litter boxes, and soil used by cats. These carry germs that can cause birth defects in the baby.   Take your prenatal vitamins.   Try taking a stool softener (if your caregiver approves) if you develop constipation. Eat more high-fiber foods, such as fresh vegetables or fruit and whole grains. Drink plenty of fluids to keep your urine clear or pale yellow.   Take warm sitz baths to soothe any pain or discomfort caused by hemorrhoids. Use hemorrhoid cream if your caregiver approves.   If you develop varicose veins, wear support hose. Elevate your feet for 15 minutes, 3 4 times a day. Limit salt in your diet.   Avoid heavy lifting, wear low heal shoes, and practice good posture.   Rest a lot with your legs elevated if you have leg cramps or low back pain.   Visit your dentist if you have not gone during your pregnancy. Use a soft toothbrush to brush your teeth and be gentle when you floss.   A sexual relationship may be continued unless your caregiver directs you otherwise.   Do not travel far distances unless it is absolutely necessary and only with the approval of your caregiver.   Take prenatal classes to understand, practice, and ask questions about the labor and delivery.   Make a trial run to the hospital.   Pack your hospital bag.   Prepare the baby's nursery.   Continue to go to all your prenatal visits as directed  by your caregiver.  SEEK MEDICAL CARE IF:   You are unsure if you are in labor or if your water has broken.   You have dizziness.   You have mild pelvic cramps, pelvic pressure, or nagging pain in your abdominal area.   You have persistent nausea, vomiting, or diarrhea.   You have a bad smelling vaginal discharge.   You have pain with urination.  SEEK IMMEDIATE MEDICAL CARE IF:    You have a fever.   You are leaking fluid from your vagina.   You have spotting or bleeding from your vagina.     You have severe abdominal cramping or pain.   You have rapid weight loss or gain.   You have shortness of breath with chest pain.   You notice sudden or extreme swelling of your face, hands, ankles, feet, or legs.   You have not felt your baby move in over an hour.   You have severe headaches that do not go away with medicine.   You have vision changes.  Document Released: 01/27/2001 Document Revised: 10/05/2012 Document Reviewed: 04/05/2012  Exit[employer] Patient Information 2014 [employer].

## 2013-03-21 NOTE — Progress Notes (Signed)
S: 23 yo G1 @40w6  day here for ROBV - having some pressure and irregular contractions - +FM - no VB, LOF  O: see flowsheet  A/P - in review of dating, pt's LMP is off by 12 days from an 18week scan. Measuring has also been more consistent with US dating.  Dating changed to US making her 6478w1d today - f/u in 1 week - pt strongly wanting to avoid induction at this time.   - labor precautions discussed.

## 2013-03-21 NOTE — Progress Notes (Signed)
Pulse: 82

## 2013-03-29 ENCOUNTER — Encounter: Payer: No Typology Code available for payment source | Admitting: Obstetrics & Gynecology

## 2013-03-31 ENCOUNTER — Ambulatory Visit (INDEPENDENT_AMBULATORY_CARE_PROVIDER_SITE_OTHER): Payer: No Typology Code available for payment source | Admitting: Obstetrics & Gynecology

## 2013-03-31 VITALS — BP 106/71 | Temp 97.7°F | Wt 155.1 lb

## 2013-03-31 DIAGNOSIS — O0930 Supervision of pregnancy with insufficient antenatal care, unspecified trimester: Secondary | ICD-10-CM

## 2013-03-31 DIAGNOSIS — O480 Post-term pregnancy: Secondary | ICD-10-CM

## 2013-03-31 LAB — US OB FOLLOW UP

## 2013-03-31 LAB — POCT URINALYSIS DIP (DEVICE)
Bilirubin Urine: NEGATIVE
Glucose, UA: NEGATIVE mg/dL
Hgb urine dipstick: NEGATIVE
Ketones, ur: NEGATIVE mg/dL
NITRITE: NEGATIVE
Protein, ur: NEGATIVE mg/dL
Specific Gravity, Urine: 1.02 (ref 1.005–1.030)
UROBILINOGEN UA: 0.2 mg/dL (ref 0.0–1.0)
pH: 7 (ref 5.0–8.0)

## 2013-03-31 NOTE — Patient Instructions (Signed)

## 2013-03-31 NOTE — Progress Notes (Signed)
Pulse- 80  Pain/pressure-pelvic

## 2013-03-31 NOTE — Progress Notes (Signed)
No complaints, requests no cervical exam NST today and AFI doesn't want to schedule induction

## 2013-04-02 ENCOUNTER — Inpatient Hospital Stay (HOSPITAL_COMMUNITY)
Admission: AD | Admit: 2013-04-02 | Discharge: 2013-04-04 | DRG: 775 | Disposition: A | Discharge status: Home / Self Care | Payer: No Typology Code available for payment source | Source: Ambulatory Visit | Attending: Obstetrics and Gynecology | Admitting: Obstetrics and Gynecology

## 2013-04-02 ENCOUNTER — Encounter (HOSPITAL_COMMUNITY): Payer: Self-pay | Admitting: *Deleted

## 2013-04-02 DIAGNOSIS — R7309 Other abnormal glucose: Secondary | ICD-10-CM

## 2013-04-02 DIAGNOSIS — IMO0001 Reserved for inherently not codable concepts without codable children: Secondary | ICD-10-CM

## 2013-04-02 DIAGNOSIS — O0930 Supervision of pregnancy with insufficient antenatal care, unspecified trimester: Secondary | ICD-10-CM

## 2013-04-02 LAB — CBC
HEMATOCRIT: 34.7 % — AB (ref 36.0–46.0)
Hemoglobin: 11.9 g/dL — ABNORMAL LOW (ref 12.0–15.0)
MCH: 29.3 pg (ref 26.0–34.0)
MCHC: 34.3 g/dL (ref 30.0–36.0)
MCV: 85.5 fL (ref 78.0–100.0)
Platelets: 188 10*3/uL (ref 150–400)
RBC: 4.06 MIL/uL (ref 3.87–5.11)
RDW: 13.6 % (ref 11.5–15.5)
WBC: 11.6 10*3/uL — ABNORMAL HIGH (ref 4.0–10.5)

## 2013-04-02 LAB — ABO/RH: ABO/RH(D): B POS

## 2013-04-02 LAB — TYPE AND SCREEN
ABO/RH(D): B POS
Antibody Screen: NEGATIVE

## 2013-04-02 LAB — RPR: RPR Ser Ql: NONREACTIVE

## 2013-04-02 LAB — OB RESULTS CONSOLE GBS: STREP GROUP B AG: NEGATIVE

## 2013-04-02 MED ORDER — IBUPROFEN 600 MG PO TABS: 600.0000 mg | ORAL_TABLET | Freq: Four times a day (QID) | ORAL | Status: DC | PRN

## 2013-04-02 MED ORDER — OXYCODONE-ACETAMINOPHEN 5-325 MG PO TABS: 1.0000 | ORAL_TABLET | ORAL | Status: DC | PRN

## 2013-04-02 MED ORDER — PHENYLEPHRINE 40 MCG/ML (10ML) SYRINGE FOR IV PUSH (FOR BLOOD PRESSURE SUPPORT)
80.0000 ug | PREFILLED_SYRINGE | INTRAVENOUS | Status: DC | PRN
  Filled 2013-04-02: qty 2

## 2013-04-02 MED ORDER — DIPHENHYDRAMINE HCL 50 MG/ML IJ SOLN: 12.5000 mg | INTRAMUSCULAR | Status: DC | PRN

## 2013-04-02 MED ORDER — TERBUTALINE SULFATE 1 MG/ML IJ SOLN: 0.2500 mg | Freq: Once | INTRAMUSCULAR | Status: AC | PRN

## 2013-04-02 MED ORDER — LACTATED RINGERS IV SOLN: 500.0000 mL | Freq: Once | INTRAVENOUS | Status: DC

## 2013-04-02 MED ORDER — OXYTOCIN 40 UNITS IN LACTATED RINGERS INFUSION - SIMPLE MED
62.5000 mL/h | INTRAVENOUS | Status: DC
  Filled 2013-04-02: qty 1000

## 2013-04-02 MED ORDER — EPHEDRINE 5 MG/ML INJ
10.0000 mg | INTRAVENOUS | Status: DC | PRN
  Filled 2013-04-02: qty 2

## 2013-04-02 MED ORDER — ONDANSETRON HCL 4 MG/2ML IJ SOLN: 4.0000 mg | Freq: Four times a day (QID) | INTRAMUSCULAR | Status: DC | PRN

## 2013-04-02 MED ORDER — OXYTOCIN BOLUS FROM INFUSION: 500.0000 mL | INTRAVENOUS | Status: DC

## 2013-04-02 MED ORDER — CITRIC ACID-SODIUM CITRATE 334-500 MG/5ML PO SOLN: 30.0000 mL | ORAL | Status: DC | PRN

## 2013-04-02 MED ORDER — LACTATED RINGERS IV SOLN: 500.0000 mL | INTRAVENOUS | Status: DC | PRN

## 2013-04-02 MED ORDER — FENTANYL 2.5 MCG/ML BUPIVACAINE 1/10 % EPIDURAL INFUSION (WH - ANES): 14.0000 mL/h | INTRAMUSCULAR | Status: DC | PRN

## 2013-04-02 MED ORDER — LACTATED RINGERS IV SOLN
INTRAVENOUS | Status: DC
  Administered 2013-04-02 (×2): via INTRAVENOUS

## 2013-04-02 MED ORDER — ACETAMINOPHEN 325 MG PO TABS: 650.0000 mg | ORAL_TABLET | ORAL | Status: DC | PRN

## 2013-04-02 MED ORDER — OXYTOCIN 40 UNITS IN LACTATED RINGERS INFUSION - SIMPLE MED: 1.0000 m[IU]/min | INTRAVENOUS | Status: DC

## 2013-04-02 MED ORDER — LIDOCAINE HCL (PF) 1 % IJ SOLN
30.0000 mL | INTRAMUSCULAR | Status: DC | PRN
  Administered 2013-04-02: 30 mL via SUBCUTANEOUS
  Filled 2013-04-02: qty 30

## 2013-04-02 NOTE — Progress Notes (Signed)
Deanna Thornton is a 23 y.o. G1P0 at 624w6d  admitted for labor at term  Subjective: Contractions are painful but tolerable.  Patient wants a natural labor and would like to continue laboring without any interventions at this time.  Objective: BP 126/75  Pulse 104  Temp(Src) 98.8 F (37.1 C) (Oral)  Resp 18  Ht 5\' 4"  (1.626 m)  Wt 70.308 kg (155 lb)  BMI 26.59 kg/m2  LMP 06/08/2012      FHT:  FHR: 145 bpm, variability: moderate,  accelerations:  Present,  decelerations:  Absent UC:   irregular, every 2-4 minutes SVE:   Dilation: 7 Effacement (%): 100 Station: 0 Exam by:: Bertram MillardJ. Lopez, RN Still some cervix on the right side.  Labs: Lab Results  Component Value Date   WBC 11.6* 04/02/2013   HGB 11.9* 04/02/2013   HCT 34.7* 04/02/2013   MCV 85.5 04/02/2013   PLT 188 04/02/2013    Assessment / Plan: Spontaneous labor, progressing normally  Labor: Progressing slowly.  Preeclampsia:  no signs or symptoms of toxicity Fetal Wellbeing:  Category I Pain Control:  Labor support without medications I/D:  n/a Anticipated MOD:  NSVD  Offered AROM for bulging bag and pitocin, but patient refused.  Continue to monitor progress and revisit augmentation in 2 hrs.  Deanna Thornton, Deanna Thornton 04/02/2013, 8:26 PM

## 2013-04-02 NOTE — [facility] Note (Signed)
Pt presents with complaints of contractions that started last night but have gotten worse this morning. States some spotting this morning.

## 2013-04-02 NOTE — Progress Notes (Signed)
Deanna Thornton is a 23 y.o. G1P0 at 1656w6d admitted for active labor  Subjective: Some contractions more painful than others, trying to get some rest   Objective: BP 120/80  Pulse 106  Temp(Src) 97.9 F (36.6 C) (Oral)  Resp 16  Ht 5\' 4"  (1.626 m)  Wt 70.308 kg (155 lb)  BMI 26.59 kg/m2  LMP 06/08/2012      FHT:  FHR: 135 bpm, variability: moderate,  accelerations:  Present,  decelerations:  Absent UC:   irregular, every 3-6 minutes SVE:   Dilation: 6 Effacement (%): 100 Station: 0 Exam by:: L. LIma, R N  Labs: Lab Results  Component Value Date   WBC 11.6* 04/02/2013   HGB 11.9* 04/02/2013   HCT 34.7* 04/02/2013   MCV 85.5 04/02/2013   PLT 188 04/02/2013    Assessment / Plan: Spontaneous labor, progressing normally  Labor: Progressing normally, contractions somewhat spaced out, patient refused AROM Preeclampsia:  no signs or symptoms of toxicity Fetal Wellbeing:  Category I Pain Control:  Labor support without medications I/D:  n/a Anticipated MOD:  NSVD  Deanna Thornton, Deanna Thornton 04/02/2013, 4:27 PM

## 2013-04-02 NOTE — H&P (Signed)
Deanna Thornton is a 23 y.o. female G1P0 at [redacted]w[redacted]d presenting for increasingly painful contractions since about 6 am this morning. She denies LOF. Good fetal movement. Small amount of spotting in mucus plug every time she wipes. Maternal Medical History:  Reason for admission: Contractions and vaginal bleeding.  Nausea.  Contractions: Onset was 3-5 hours ago.   Frequency: regular.   Perceived severity is moderate.    Fetal activity: Perceived fetal activity is normal.   Last perceived fetal movement was within the past hour.    Prenatal complications: No hypertension, infection, pre-eclampsia or preterm labor.   Prenatal Complications - Diabetes: none.    OB History   Grav Para Term Preterm Abortions TAB SAB Ect Mult Living   1              Past Medical History  Diagnosis Date  . Medical history non-contributory    Past Surgical History  Procedure Laterality Date  . No past surgeries     Family History: family history includes Hypertension in her mother; Hyperthyroidism in her mother. Social History:  reports that she has never smoked. She has never used smokeless tobacco. She reports that she does not drink alcohol or use illicit drugs.   Prenatal Transfer Tool  Maternal Diabetes: No Genetic Screening: Declined Maternal Ultrasounds/Referrals: Abnormal:  Findings:   Isolated EIF (echogenic intracardiac focus), Isolated choroid plexus cyst Fetal Ultrasounds or other Referrals:  None Maternal Substance Abuse:  No Significant Maternal Medications:  None Significant Maternal Lab Results:  Lab values include: Group B Strep negative Other Comments:  None  Review of Systems  Constitutional: Negative for fever.  Gastrointestinal: Negative for nausea and vomiting.  Neurological: Negative for dizziness and headaches.  All other systems reviewed and are negative.    Dilation: 4.5 Effacement (%): 100 Station: -2 Exam by:: Ginger Morris, RN Blood pressure 122/83, pulse 97,  temperature 97.6 F (36.4 C), temperature source Oral, resp. rate 16, height 5\' 4"  (1.626 m), weight 70.308 kg (155 lb), last menstrual period 06/08/2012. Maternal Exam:  Uterine Assessment: Contraction strength is moderate.  Contraction duration is 60 seconds. Contraction frequency is regular.   Abdomen: Fetal presentation: vertex  Introitus: Normal vulva. Normal vagina.  Cervix: Cervix evaluated by digital exam.     Fetal Exam Fetal Monitor Review: Mode: ultrasound.   Baseline rate: 135.  Variability: moderate (6-25 bpm).   Pattern: accelerations present and no decelerations.    Fetal State Assessment: Category I - tracings are normal.     Physical Exam  Nursing note and vitals reviewed. Constitutional: She is oriented to person, place, and time. She appears well-developed and well-nourished. No distress.  HENT:  Head: Normocephalic and atraumatic.  Eyes: Conjunctivae are normal. Right eye exhibits no discharge. Left eye exhibits no discharge.  Cardiovascular: Normal rate and intact distal pulses.   Respiratory: Effort normal.  GI: Soft. She exhibits no distension. There is no tenderness.  Appropriately gravid uterus  Musculoskeletal: She exhibits no edema and no tenderness.  Neurological: She is alert and oriented to person, place, and time.  Skin: Skin is warm and dry. She is not diaphoretic.  Psychiatric: She has a normal mood and affect. Her behavior is normal.    Prenatal labs: ABO, Rh: B/POS/-- (10/01 0858) Antibody: NEG (10/01 0858) Rubella: 1.41 (10/01 0858) RPR: NON REAC (10/29 1149)  HBsAg: NEGATIVE (10/01 0858)  HIV: NON REACTIVE (10/29 1149)  GBS:   negative  Assessment/Plan: Deanna Thornton is a 23 y.o.  female G1P0 at 5720w6d presenting for increasingly painful contractions since about 6 am this morning.  Labor Plan #Labor: progressing well, contracting every 2-5 minutes, recheck in 2 hours #Pain: epidural on request #FWB: category 1 #ID: GBS  negative  Beverely Lowdamo, Cambrie Sonnenfeld 04/02/2013, 11:32 AM

## 2013-04-03 ENCOUNTER — Encounter (HOSPITAL_COMMUNITY): Payer: Self-pay | Admitting: *Deleted

## 2013-04-03 LAB — CBC
HCT: 23.8 % — ABNORMAL LOW (ref 36.0–46.0)
Hemoglobin: 8.1 g/dL — ABNORMAL LOW (ref 12.0–15.0)
MCH: 28.9 pg (ref 26.0–34.0)
MCHC: 34 g/dL (ref 30.0–36.0)
MCV: 85 fL (ref 78.0–100.0)
Platelets: 174 10*3/uL (ref 150–400)
RBC: 2.8 MIL/uL — ABNORMAL LOW (ref 3.87–5.11)
RDW: 13.7 % (ref 11.5–15.5)
WBC: 13 10*3/uL — ABNORMAL HIGH (ref 4.0–10.5)

## 2013-04-03 MED ORDER — ZOLPIDEM TARTRATE 5 MG PO TABS
5.0000 mg | ORAL_TABLET | Freq: Every evening | ORAL | Status: DC | PRN
Start: 2013-04-03 — End: 2013-04-04

## 2013-04-03 MED ORDER — ONDANSETRON HCL 4 MG PO TABS: 4.0000 mg | ORAL_TABLET | ORAL | Status: DC | PRN

## 2013-04-03 MED ORDER — DIBUCAINE 1 % RE OINT: 1.0000 "application " | TOPICAL_OINTMENT | RECTAL | Status: DC | PRN

## 2013-04-03 MED ORDER — SENNOSIDES-DOCUSATE SODIUM 8.6-50 MG PO TABS
2.0000 | ORAL_TABLET | ORAL | Status: DC
  Administered 2013-04-03: 2 via ORAL
  Filled 2013-04-03: qty 2

## 2013-04-03 MED ORDER — DIPHENHYDRAMINE HCL 25 MG PO CAPS: 25.0000 mg | ORAL_CAPSULE | Freq: Four times a day (QID) | ORAL | Status: DC | PRN

## 2013-04-03 MED ORDER — ONDANSETRON HCL 4 MG/2ML IJ SOLN: 4.0000 mg | INTRAMUSCULAR | Status: DC | PRN

## 2013-04-03 MED ORDER — IBUPROFEN 600 MG PO TABS
600.0000 mg | ORAL_TABLET | Freq: Four times a day (QID) | ORAL | Status: DC
  Administered 2013-04-03 – 2013-04-04 (×5): 600 mg via ORAL
  Filled 2013-04-03 (×5): qty 1

## 2013-04-03 MED ORDER — TETANUS-DIPHTH-ACELL PERTUSSIS 5-2.5-18.5 LF-MCG/0.5 IM SUSP: 0.5000 mL | Freq: Once | INTRAMUSCULAR | Status: DC

## 2013-04-03 MED ORDER — SIMETHICONE 80 MG PO CHEW: 80.0000 mg | CHEWABLE_TABLET | ORAL | Status: DC | PRN

## 2013-04-03 MED ORDER — WITCH HAZEL-GLYCERIN EX PADS: 1.0000 "application " | MEDICATED_PAD | CUTANEOUS | Status: DC | PRN

## 2013-04-03 MED ORDER — OXYCODONE-ACETAMINOPHEN 5-325 MG PO TABS: 1.0000 | ORAL_TABLET | ORAL | Status: DC | PRN

## 2013-04-03 MED ORDER — BENZOCAINE-MENTHOL 20-0.5 % EX AERO
1.0000 "application " | INHALATION_SPRAY | CUTANEOUS | Status: DC | PRN
  Filled 2013-04-03: qty 56

## 2013-04-03 MED ORDER — PRENATAL MULTIVITAMIN CH
1.0000 | ORAL_TABLET | Freq: Every day | ORAL | Status: DC
  Administered 2013-04-03 – 2013-04-04 (×2): 1 via ORAL
  Filled 2013-04-03 (×2): qty 1

## 2013-04-03 MED ORDER — LANOLIN HYDROUS EX OINT: TOPICAL_OINTMENT | CUTANEOUS | Status: DC | PRN

## 2013-04-03 NOTE — Progress Notes (Addendum)
Post Partum Day 1  Subjective: up ad lib, voiding and mild pain at IV site.   Objective: Blood pressure 107/72, pulse 106, temperature 98.8 F (37.1 C), temperature source Oral, resp. rate 16, height 5\' 4"  (1.626 m), weight 70.308 kg (155 lb), last menstrual period 06/08/2012, SpO2 99.00%, unknown if currently breastfeeding.  Physical Exam:  General: alert Lochia: appropriate Uterine Fundus: firm Incision: NA DVT Evaluation: No evidence of DVT seen on physical exam.   Recent Labs  04/02/13 1112  HGB 11.9*  HCT 34.7*    Assessment/Plan: Plan for discharge tomorrow, Breastfeeding, Lactation consult and Contraception condoms (considering other options)   LOS: 1 day   Wenda LowJoyner, Rexine Gowens 04/03/2013, 7:31 AM

## 2013-04-03 NOTE — Lactation Note (Signed)
This note was copied from the chart of Boy Avie Echevariaang Getty. Lactation Consultation Note Initial consult:  Baby boy 5313 hours old.  Mother states she has no milk, reviewed colostrum volume.  Taught hand expression and mother was excited to view colostrum.  Assisted baby in football hold, rhythmical sucking viewed for 5 min. then baby fell asleep.  Reviewed waking techniques, breast massage and stimulation to wake baby.  Mother seemed to like cradle position, LC demonstrated cross cradle on left breast.  Mother's nipple is pink and sore.  Encouraged hand expression of colostrum to aid in healing.  Reviewed basics and lactation support services and brochure.  Encouraged mother to call for assistance with next feeding.   Patient Name: Boy Avie Echevariaang Endres WGNFA'OToday's Date: 04/03/2013 Reason for consult: Initial assessment   Maternal Data Has patient been taught Hand Expression?: Yes Does the patient have breastfeeding experience prior to this delivery?: No  Feeding Feeding Type: Breast Fed Length of feed: 5 min  LATCH Score/Interventions Latch: Repeated attempts needed to sustain latch, nipple held in mouth throughout feeding, stimulation needed to elicit sucking reflex. Intervention(s): Adjust position;Assist with latch;Breast massage  Audible Swallowing: A few with stimulation Intervention(s): Alternate breast massage;Hand expression;Skin to skin  Type of Nipple: Everted at rest and after stimulation  Comfort (Breast/Nipple): Filling, red/small blisters or bruises, mild/mod discomfort  Problem noted: Mild/Moderate discomfort Interventions (Mild/moderate discomfort): Hand expression  Hold (Positioning): Assistance needed to correctly position infant at breast and maintain latch.  LATCH Score: 6  Lactation Tools Discussed/Used     Consult Status Consult Status: Follow-up Date: 04/03/13 Follow-up type: In-patient    Dahlia ByesBerkelhammer, Ruth [facility] 04/03/2013, 12:36 PM

## 2013-04-04 ENCOUNTER — Encounter: Payer: Self-pay | Admitting: Nurse Practitioner

## 2013-04-04 ENCOUNTER — Other Ambulatory Visit: Payer: No Typology Code available for payment source

## 2013-04-04 MED ORDER — IBUPROFEN 600 MG PO TABS: 600.0000 mg | ORAL_TABLET | Freq: Four times a day (QID) | ORAL | Status: DC

## 2013-04-04 NOTE — Discharge Summary (Signed)
Obstetric Discharge Summary Reason for Admission: onset of labor Prenatal Procedures: none Intrapartum Procedures: spontaneous vaginal delivery Postpartum Procedures: none Complications-Operative and Postpartum: 2nd degree laceration Hemoglobin  Date Value Ref Range Status  04/03/2013 8.1* 12.0 - 15.0 g/dL Final     DELTA CHECK NOTED     REPEATED TO VERIFY     HCT  Date Value Ref Range Status  04/03/2013 23.8* 36.0 - 46.0 % Final   Hospital Course: Hospital course uneventful. Ms Deanna Thornton 23 y.o. G1P1001 2844w6d delivered SVD on 04/02/13 at 2249 a healthy boy.  Ms. Deanna Thornton is breastfeeding and plans to use condoms as MOC. Still considering circumcision, but will be OP.   Physical Exam:  General: alert, cooperative and no distress Lochia: appropriate Uterine Fundus: firm Incision: N/A DVT Evaluation: No evidence of DVT seen on physical exam. No significant calf/ankle edema.  Discharge Diagnoses: Term Pregnancy-delivered  Discharge Information: Date: 04/04/2013 Activity: pelvic rest Diet: routine Medications: Ibuprofen Condition: stable Instructions: refer to practice specific booklet Discharge to: home Follow-up Information   [facility].   Specialty:  Obstetrics and Gynecology   Contact information:   [address] [place] [id number] [phone]      Newborn Data: Live born female  Birth Weight: 7 lb 4.2 oz (3294 g) APGAR: 8, 9  Home with mother.  Duane Bostonuke, Angela N 04/04/2013, 7:27 AM  I have seen and examined this patient and agree with above documentation in the PA student's note.   Rulon AbideKeli Araseli Sherry, M.D. [facility] 04/04/2013 9:13 AM

## 2013-04-04 NOTE — Lactation Note (Signed)
This note was copied from the chart of Deanna Thornton. Lactation Consultation Note Follow up consult:  [name] 34 hours old, baby to breast latched well with assistance from Randa LynnLinda Nash, RN Baby feeding in active pattern. Mother's left nipple sore, comfort gels provided and use reviewed.  Reviewed hand expressed milk to aid in healing also. Reviewed lactation support services, engorgement care and breastfeeding 8-12 times a day, monitoring voids/stools. Encouraged mother to call for further assitance if needed.  Patient Name: Deanna Thornton UEAVW'UToday's Date: 04/04/2013 Reason for consult: Follow-up assessment   Maternal Data    Feeding Feeding Type: Breast Fed  LATCH Score/Interventions Latch: Grasps breast easily, tongue down, lips flanged, rhythmical sucking. Intervention(s): Breast massage;Assist with latch  Audible Swallowing: Spontaneous and intermittent  Type of Nipple: Everted at rest and after stimulation  Comfort (Breast/Nipple): Filling, red/small blisters or bruises, mild/mod discomfort  Problem noted: Mild/Moderate discomfort Interventions (Mild/moderate discomfort): Comfort gels  Hold (Positioning): Assistance needed to correctly position infant at breast and maintain latch.  LATCH Score: 8  Lactation Tools Discussed/Used     Consult Status Consult Status: Complete    Hardie PulleyBerkelhammer, Deanna Thornton 04/04/2013, 9:47 AM

## 2013-04-04 NOTE — Discharge Instructions (Signed)

## 2013-04-05 NOTE — Discharge Summary (Signed)
Attestation of Attending Supervision of Fellow: Evaluation and management procedures were performed by the Fellow under my supervision and collaboration.  I have reviewed the Fellow's note and chart, and I agree with the management and plan.    

## 2013-04-18 ENCOUNTER — Encounter: Payer: Self-pay | Admitting: *Deleted

## 2013-05-11 ENCOUNTER — Encounter: Payer: Self-pay | Admitting: Nurse Practitioner

## 2013-05-11 ENCOUNTER — Ambulatory Visit (INDEPENDENT_AMBULATORY_CARE_PROVIDER_SITE_OTHER): Payer: No Typology Code available for payment source | Admitting: Nurse Practitioner

## 2013-05-11 DIAGNOSIS — Z309 Encounter for contraceptive management, unspecified: Secondary | ICD-10-CM

## 2013-05-11 MED ORDER — NORGESTIMATE-ETH ESTRADIOL 0.25-35 MG-MCG PO TABS: 1.0000 | ORAL_TABLET | Freq: Every day | ORAL | Status: DC

## 2013-05-11 NOTE — Patient Instructions (Signed)
Contraception Choices Contraception (birth control) is the use of any methods or devices to prevent pregnancy. Below are some methods to help avoid pregnancy. HORMONAL METHODS   Contraceptive implant This is a thin, plastic tube containing progesterone hormone. It does not contain estrogen hormone. Your health care provider inserts the tube in the inner part of the upper arm. The tube can remain in place for up to 3 years. After 3 years, the implant must be removed. The implant prevents the ovaries from releasing an egg (ovulation), thickens the cervical mucus to prevent sperm from entering the uterus, and thins the lining of the inside of the uterus.  Progesterone-only injections These injections are given every 3 months by your health care provider to prevent pregnancy. This synthetic progesterone hormone stops the ovaries from releasing eggs. It also thickens cervical mucus and changes the uterine lining. This makes it harder for sperm to survive in the uterus.  Birth control pills These pills contain estrogen and progesterone hormone. They work by preventing the ovaries from releasing eggs (ovulation). They also cause the cervical mucus to thicken, preventing the sperm from entering the uterus. Birth control pills are prescribed by a health care provider.Birth control pills can also be used to treat heavy periods.  Minipill This type of birth control pill contains only the progesterone hormone. They are taken every day of each month and must be prescribed by your health care provider.  Birth control patch The patch contains hormones similar to those in birth control pills. It must be changed once a week and is prescribed by a health care provider.  Vaginal ring The ring contains hormones similar to those in birth control pills. It is left in the vagina for 3 weeks, removed for 1 week, and then a new one is put back in place. The patient must be comfortable inserting and removing the ring from the  vagina.A health care provider's prescription is necessary.  Emergency contraception Emergency contraceptives prevent pregnancy after unprotected sexual intercourse. This pill can be taken right after sex or up to 5 days after unprotected sex. It is most effective the sooner you take the pills after having sexual intercourse. Most emergency contraceptive pills are available without a prescription. Check with your pharmacist. Do not use emergency contraception as your only form of birth control. BARRIER METHODS   Female condom This is a thin sheath (latex or rubber) that is worn over the penis during sexual intercourse. It can be used with spermicide to increase effectiveness.  Female condom. This is a soft, loose-fitting sheath that is put into the vagina before sexual intercourse.  Diaphragm This is a soft, latex, dome-shaped barrier that must be fitted by a health care provider. It is inserted into the vagina, along with a spermicidal jelly. It is inserted before intercourse. The diaphragm should be left in the vagina for 6 to 8 hours after intercourse.  Cervical cap This is a round, soft, latex or plastic cup that fits over the cervix and must be fitted by a health care provider. The cap can be left in place for up to 48 hours after intercourse.  Sponge This is a soft, circular piece of polyurethane foam. The sponge has spermicide in it. It is inserted into the vagina after wetting it and before sexual intercourse.  Spermicides These are chemicals that kill or block sperm from entering the cervix and uterus. They come in the form of creams, jellies, suppositories, foam, or tablets. They do not require a   prescription. They are inserted into the vagina with an applicator before having sexual intercourse. The process must be repeated every time you have sexual intercourse. INTRAUTERINE CONTRACEPTION  Intrauterine device (IUD) This is a T-shaped device that is put in a woman's uterus during a  menstrual period to prevent pregnancy. There are 2 types:  Copper IUD This type of IUD is wrapped in copper wire and is placed inside the uterus. Copper makes the uterus and fallopian tubes produce a fluid that kills sperm. It can stay in place for 10 years.  Hormone IUD This type of IUD contains the hormone progestin (synthetic progesterone). The hormone thickens the cervical mucus and prevents sperm from entering the uterus, and it also thins the uterine lining to prevent implantation of a fertilized egg. The hormone can weaken or kill the sperm that get into the uterus. It can stay in place for 3 5 years, depending on which type of IUD is used. PERMANENT METHODS OF CONTRACEPTION  Female tubal ligation This is when the woman's fallopian tubes are surgically sealed, tied, or blocked to prevent the egg from traveling to the uterus.  Hysteroscopic sterilization This involves placing a small coil or insert into each fallopian tube. Your doctor uses a technique called hysteroscopy to do the procedure. The device causes scar tissue to form. This results in permanent blockage of the fallopian tubes, so the sperm cannot fertilize the egg. It takes about 3 months after the procedure for the tubes to become blocked. You must use another form of birth control for these 3 months.  Female sterilization This is when the female has the tubes that carry sperm tied off (vasectomy).This blocks sperm from entering the vagina during sexual intercourse. After the procedure, the man can still ejaculate fluid (semen). NATURAL PLANNING METHODS  Natural family planning This is not having sexual intercourse or using a barrier method (condom, diaphragm, cervical cap) on days the woman could become pregnant.  Calendar method This is keeping track of the length of each menstrual cycle and identifying when you are fertile.  Ovulation method This is avoiding sexual intercourse during ovulation.  Symptothermal method This is  avoiding sexual intercourse during ovulation, using a thermometer and ovulation symptoms.  Post ovulation method This is timing sexual intercourse after you have ovulated. Regardless of which type or method of contraception you choose, it is important that you use condoms to protect against the transmission of sexually transmitted infections (STIs). Talk with your health care provider about which form of contraception is most appropriate for you. Document Released: 02/02/2005 Document Revised: 10/05/2012 Document Reviewed: 07/28/2012 [employer] Patient Information 2014 [employer].  

## 2013-05-11 NOTE — Progress Notes (Signed)
Patient ID: Deanna Thornton, female   DOB: 05/26/1990, 23 y.o.   MRN: [id number] Subjective:     Deanna Thornton is a 23 y.o. female who presents for a postpartum visit. She is 5 week postpartum following a spontaneous vaginal delivery. I have fully reviewed the prenatal and intrapartum course. The delivery was at 41 gestational weeks. Outcome: spontaneous vaginal delivery. Anesthesia: none. Postpartum course has been uneventful. Baby's course has been uneventful except for UTI. Baby is feeding by both breast and bottle - Gerber Gentle. Bleeding light spotting. Bowel function is abnormal: constipated stopped PNV. Marland Kitchen. Bladder function is normal. Patient is not sexually active. Contraception method is condoms. Postpartum depression screening: negative. Pt will consider BCPs as she has very heavy menses and needs better birth control. She is breast and bottle feeding  The following portions of the patient's history were reviewed and updated as appropriate: past family history, past medical history, past social history, past surgical history and problem list.  Review of Systems Pertinent items are noted in HPI.   Objective:    BP 120/83  Pulse 75  Temp(Src) 98.1 F (36.7 C) (Oral)  Ht 5\' 7"  (1.702 m)  Wt 131 lb 12.8 oz (59.784 kg)  BMI 20.64 kg/m2  Breastfeeding? Yes  General:  alert   Breasts:  negative  Lungs: clear to auscultation bilaterally  Heart:  regular rate and rhythm, S1, S2 normal, no murmur, click, rub or gallop  Abdomen: soft, non-tender; bowel sounds normal; no masses,  no organomegaly   Vulva:  normal  Vagina: not evaluated  Cervix:  absent  Corpus: normal size, contour, position, consistency, mobility, non-tender  Adnexa:  normal adnexa  Rectal Exam: Normal rectovaginal exam        Assessment:    postpartum exam. Pap smear not done at today's visit.  Contraception Management  Plan:    1. Contraception: condoms and OCP (estrogen/progesterone) 2. OrthoCyclen daily # 1pack/  11 refills 3. Follow up in: 1 year or as needed.

## 2013-12-18 ENCOUNTER — Encounter: Payer: Self-pay | Admitting: Nurse Practitioner

## 2014-01-18 ENCOUNTER — Encounter: Payer: Self-pay | Admitting: Obstetrics & Gynecology

## 2014-02-16 NOTE — L&D Delivery Note (Signed)
Patient is 24 y.o. G2P1001 [redacted]w[redacted]d admitted in advanced labor.   Delivery Note At 7:42 PM a viable female was delivered via Vaginal, Spontaneous Delivery (Presentation: Left Occiput Anterior).  APGAR: 9, 10; weight pending.   Placenta status: Intact, Spontaneous.  Cord: 3 vessels with the following complications: None.    Anesthesia: None  Episiotomy: None Lacerations: 1st degree Suture Repair: 3.0 vicryl Est. Blood Loss (mL): 300  Mom to postpartum.  Baby to Couplet care / Skin to Skin.  Tarri Abernethy, MD PGY-1 [facility]  10/29/2014, 8:20 PM  I was gowned and gloved for delivery Agree with note Patient came in to MAU completely dilated and preparations were made for delivery No difficulty with delivery Baby did well as did mom I sutured the small midline perineal laceration Aviva Signs, CNM

## 2014-05-29 ENCOUNTER — Inpatient Hospital Stay (HOSPITAL_COMMUNITY)
Admission: EM | Admit: 2014-05-29 | Discharge: 2014-05-29 | Disposition: A | Discharge status: Home / Self Care | Payer: Medicaid Other | Source: Ambulatory Visit | Attending: Family Medicine | Admitting: Family Medicine

## 2014-05-29 ENCOUNTER — Encounter (HOSPITAL_COMMUNITY): Payer: Self-pay | Admitting: *Deleted

## 2014-05-29 DIAGNOSIS — Z3201 Encounter for pregnancy test, result positive: Secondary | ICD-10-CM | POA: Diagnosis not present

## 2014-05-29 DIAGNOSIS — N911 Secondary amenorrhea: Secondary | ICD-10-CM | POA: Insufficient documentation

## 2014-05-29 DIAGNOSIS — O9989 Other specified diseases and conditions complicating pregnancy, childbirth and the puerperium: Secondary | ICD-10-CM | POA: Diagnosis present

## 2014-05-29 DIAGNOSIS — Z3402 Encounter for supervision of normal first pregnancy, second trimester: Secondary | ICD-10-CM

## 2014-05-29 DIAGNOSIS — Z3492 Encounter for supervision of normal pregnancy, unspecified, second trimester: Secondary | ICD-10-CM

## 2014-05-29 MED ORDER — IBUPROFEN 600 MG PO TABS: 600.0000 mg | ORAL_TABLET | Freq: Four times a day (QID) | ORAL | Status: DC

## 2014-05-29 NOTE — Discharge Instructions (Signed)
Second Trimester of Pregnancy °The second trimester is from week 13 through week 28, months 4 through 6. The second trimester is often a time when you feel your best. Your body has also adjusted to being pregnant, and you begin to feel better physically. Usually, morning sickness has lessened or quit completely, you may have more energy, and you may have an increase in appetite. The second trimester is also a time when the fetus is growing rapidly. At the end of the sixth month, the fetus is about 9 inches long and weighs about 1½ pounds. You will likely begin to feel the baby move (quickening) between 18 and 20 weeks of the pregnancy. °BODY CHANGES °Your body goes through many changes during pregnancy. The changes vary from woman to woman.  °· Your weight will continue to increase. You will notice your lower abdomen bulging out. °· You may begin to get stretch marks on your hips, abdomen, and breasts. °· You may develop headaches that can be relieved by medicines approved by your health care provider. °· You may urinate more often because the fetus is pressing on your bladder. °· You may develop or continue to have heartburn as a result of your pregnancy. °· You may develop constipation because certain hormones are causing the muscles that push waste through your intestines to slow down. °· You may develop hemorrhoids or swollen, bulging veins (varicose veins). °· You may have back pain because of the weight gain and pregnancy hormones relaxing your joints between the bones in your pelvis and as a result of a shift in weight and the muscles that support your balance. °· Your breasts will continue to grow and be tender. °· Your gums may bleed and may be sensitive to brushing and flossing. °· Dark spots or blotches (chloasma, mask of pregnancy) may develop on your face. This will likely fade after the baby is born. °· A dark line from your belly button to the pubic area (linea nigra) may appear. This will likely fade  after the baby is born. °· You may have changes in your hair. These can include thickening of your hair, rapid growth, and changes in texture. Some women also have hair loss during or after pregnancy, or hair that feels dry or thin. Your hair will most likely return to normal after your baby is born. °WHAT TO EXPECT AT YOUR PRENATAL VISITS °During a routine prenatal visit: °· You will be weighed to make sure you and the fetus are growing normally. °· Your blood pressure will be taken. °· Your abdomen will be measured to track your baby's growth. °· The fetal heartbeat will be listened to. °· Any test results from the previous visit will be discussed. °Your health care provider may ask you: °· How you are feeling. °· If you are feeling the baby move. °· If you have had any abnormal symptoms, such as leaking fluid, bleeding, severe headaches, or abdominal cramping. °· If you have any questions. °Other tests that may be performed during your second trimester include: °· Blood tests that check for: °¨ Low iron levels (anemia). °¨ Gestational diabetes (between 24 and 28 weeks). °¨ Rh antibodies. °· Urine tests to check for infections, diabetes, or protein in the urine. °· An ultrasound to confirm the proper growth and development of the baby. °· An amniocentesis to check for possible genetic problems. °· Fetal screens for spina bifida and Down syndrome. °HOME CARE INSTRUCTIONS  °· Avoid all smoking, herbs, alcohol, and unprescribed   drugs. These chemicals affect the formation and growth of the baby. °· Follow your health care provider's instructions regarding medicine use. There are medicines that are either safe or unsafe to take during pregnancy. °· Exercise only as directed by your health care provider. Experiencing uterine cramps is a good sign to stop exercising. °· Continue to eat regular, healthy meals. °· Wear a good support bra for breast tenderness. °· Do not use hot tubs, steam rooms, or saunas. °· Wear your  seat belt at all times when driving. °· Avoid raw meat, uncooked cheese, cat litter boxes, and soil used by cats. These carry germs that can cause birth defects in the baby. °· Take your prenatal vitamins. °· Try taking a stool softener (if your health care provider approves) if you develop constipation. Eat more high-fiber foods, such as fresh vegetables or fruit and whole grains. Drink plenty of fluids to keep your urine clear or pale yellow. °· Take warm sitz baths to soothe any pain or discomfort caused by hemorrhoids. Use hemorrhoid cream if your health care provider approves. °· If you develop varicose veins, wear support hose. Elevate your feet for 15 minutes, 3-4 times a day. Limit salt in your diet. °· Avoid heavy lifting, wear low heel shoes, and practice good posture. °· Rest with your legs elevated if you have leg cramps or low back pain. °· Visit your dentist if you have not gone yet during your pregnancy. Use a soft toothbrush to brush your teeth and be gentle when you floss. °· A sexual relationship may be continued unless your health care provider directs you otherwise. °· Continue to go to all your prenatal visits as directed by your health care provider. °SEEK MEDICAL CARE IF:  °· You have dizziness. °· You have mild pelvic cramps, pelvic pressure, or nagging pain in the abdominal area. °· You have persistent nausea, vomiting, or diarrhea. °· You have a bad smelling vaginal discharge. °· You have pain with urination. °SEEK IMMEDIATE MEDICAL CARE IF:  °· You have a fever. °· You are leaking fluid from your vagina. °· You have spotting or bleeding from your vagina. °· You have severe abdominal cramping or pain. °· You have rapid weight gain or loss. °· You have shortness of breath with chest pain. °· You notice sudden or extreme swelling of your face, hands, ankles, feet, or legs. °· You have not felt your baby move in over an hour. °· You have severe headaches that do not go away with  medicine. °· You have vision changes. °Document Released: 01/27/2001 Document Revised: 02/07/2013 Document Reviewed: 04/05/2012 °[employer]® Patient Information ©2015 [employer]. This information is not intended to replace advice given to you by your health care provider. Make sure you discuss any questions you have with your health care provider. ° °[facility]  ° °[facility]      Phone: [phone] ° °[facility]     Phone: [phone] ° °[facility]  Phone: [phone] ° °[facility]  Phone: [phone] ° °[facility]    Phone: [phone]  ° °[facility]    Phone: [phone] ° °[facility]    Phone: [phone] ° °[facility]    Phone: [phone] ° °[facility]    Phone: [phone] ° °[facility] Phone: [phone]  ° °[facility]  Phone: [phone] ° °[facility]    Phone: [phone] ° °[facility]   Phone: [phone] ° °[facility]      Phone: [phone] ° °[facility]    Phone: [phone] ° °[facility]     Phone: [phone] °

## 2014-05-29 NOTE — MAU Note (Signed)
Just needs preg test, needs verification/proof of preg. Denies any problems, pain or bleeding

## 2014-05-29 NOTE — MAU Provider Note (Signed)
Chief Complaint: Possible Pregnancy   First Provider Initiated Contact with Patient 05/29/14 [phone]      SUBJECTIVE HPI: Deanna Thornton is a 24 y.o. G2P1001 at [redacted]w[redacted]d by LMP who presents to M[facility] Admissions questing pregnancy verification letter. Denies fever, chills, abdominal pain or bleeding. Will probably get prenatal care at [facility] outpatient clinic since she went there before.  Past Medical History  Diagnosis Date  . Medical history non-contributory    OB History  Gravida Para Term Preterm AB SAB TAB Ectopic Multiple Living  # Outcome Date GA Lbr Len/2nd Weight Sex Delivery Anes PTL Lv  2 Current           1 Term 04/02/13 [redacted]w[redacted]d 15:31 / 00:18 7 lb 4.2 oz (3.294 kg) M Vag-Spont None  Y     Past Surgical History  Procedure Laterality Date  . No past surgeries     History   Social History  . Marital Status: Single    Spouse Name: N/A  . Number of Children: N/A  . Years of Education: N/A   Occupational History  . Not on file.   Social History Main Topics  . Smoking status: Never Smoker   . Smokeless tobacco: Never Used  . Alcohol Use: No  . Drug Use: No  . Sexual Activity: Yes    Birth Control/ Protection: None   Other Topics Concern  . Not on file   Social History Narrative   No current facility-administered medications on file prior to encounter.   Current Outpatient Prescriptions on File Prior to Encounter  Medication Sig Dispense Refill  . norgestimate-ethinyl estradiol (ORTHO-CYCLEN,SPRINTEC,PREVIFEM) 0.25-35 MG-MCG tablet Take 1 tablet by mouth daily. 1 Package 11  . Prenatal Multivit-Min-Fe-FA (PRENATAL VITAMINS) 0.8 MG tablet Take 1 tablet by mouth daily.     No Known Allergies  Review of Systems  Constitutional: Negative for fever and chills.  Gastrointestinal: Negative for nausea, vomiting and abdominal pain.  Genitourinary:       Negative for vaginal bleeding or vaginal discharge.    OBJECTIVE Blood pressure  119/71, pulse 104, temperature 97.9 F (36.6 C), temperature source Oral, resp. rate 16, height 5' 2.5" (1.588 m), weight 143 lb (64.864 kg), last menstrual period 01/14/2014, currently breastfeeding. GENERAL: Well-developed, well-nourished female in no acute distress.  HEART: normal rate RESP: normal effort GI: Abdomen soft, non-tender. Gravid, size equals dates. NEURO: Alert and oriented SPECULUM EXAM: Deferred Fetal heart rate 165 by Doppler.  LAB RESULTS  IMAGING No results found.  MAU COURSE  ASSESSMENT 1. Secondary amenorrhea   2. Pregnancy test positive for normal first pregnancy in second trimester     PLAN Discharge home in stable condition. Pregnancy verification letter given. List of providers given. Second prep trimester precautions reviewed. Anatomy scan ordered outpatient.     Follow-up Information    Follow up with Obstetrician of your choice.   Why:  Start prenatal care      Follow up with [facility] [place] [facility].   Why:  As needed in emergencies   Contact information:   [address] [id number] mc [place] [address] [phone]       Medication List    STOP taking these medications        norgestimate-ethinyl estradiol 0.25-35 MG-MCG tablet  Commonly known as:  ORTHO-CYCLEN,SPRINTEC,PREVIFEM      TAKE these medications  ibuprofen 600 MG tablet  Commonly known as:  ADVIL,MOTRIN  Take 1 tablet (600 mg total) by mouth every 6 (six) hours.     Prenatal Vitamins 0.8 MG tablet  Take 1 tablet by mouth daily.       [place] Elian Gloster, CNM 05/29/2014  9:43 AM

## 2014-06-06 ENCOUNTER — Encounter: Payer: Self-pay | Admitting: Advanced Practice Midwife

## 2014-06-06 ENCOUNTER — Ambulatory Visit (INDEPENDENT_AMBULATORY_CARE_PROVIDER_SITE_OTHER): Payer: Self-pay | Admitting: Advanced Practice Midwife

## 2014-06-06 VITALS — BP 112/67 | HR 77 | Temp 98.2°F | Wt 143.1 lb

## 2014-06-06 DIAGNOSIS — Z3490 Encounter for supervision of normal pregnancy, unspecified, unspecified trimester: Secondary | ICD-10-CM | POA: Insufficient documentation

## 2014-06-06 DIAGNOSIS — Z124 Encounter for screening for malignant neoplasm of cervix: Secondary | ICD-10-CM

## 2014-06-06 DIAGNOSIS — Z113 Encounter for screening for infections with a predominantly sexual mode of transmission: Secondary | ICD-10-CM

## 2014-06-06 DIAGNOSIS — Z3492 Encounter for supervision of normal pregnancy, unspecified, second trimester: Secondary | ICD-10-CM

## 2014-06-06 DIAGNOSIS — Z118 Encounter for screening for other infectious and parasitic diseases: Secondary | ICD-10-CM

## 2014-06-06 LAB — POCT URINALYSIS DIP (DEVICE)
Bilirubin Urine: NEGATIVE
GLUCOSE, UA: NEGATIVE mg/dL
Hgb urine dipstick: NEGATIVE
Ketones, ur: NEGATIVE mg/dL
Nitrite: NEGATIVE
PH: 6 (ref 5.0–8.0)
PROTEIN: NEGATIVE mg/dL
Specific Gravity, Urine: 1.025 (ref 1.005–1.030)
Urobilinogen, UA: 1 mg/dL (ref 0.0–1.0)

## 2014-06-06 NOTE — Progress Notes (Signed)
   Subjective:    Deanna Thornton is a G2P1001 663w3d being seen today for her first obstetrical visit.  Her obstetrical history is significant for Close pregnancy spacing and uncertain LMP (breastfeeding at conception). Patient does intend to breast feed. Pregnancy history fully reviewed.  Patient reports no complaints.  Filed Vitals:   06/06/14 0918  BP: 112/67  Pulse: 77  Temp: 98.2 F (36.8 C)  Weight: 143 lb 1.6 oz (64.91 kg)    HISTORY: OB History  Gravida Para Term Preterm AB SAB TAB Ectopic Multiple Living  2 1 1       1     # Outcome Date GA Lbr Len/2nd Weight Sex Delivery Anes PTL Lv  2 Current           1 Term 04/02/13 607w6d 15:31 / 00:18 7 lb 4.2 oz (3.294 kg) M Vag-Spont None  Y     Past Medical History  Diagnosis Date  . Medical history non-contributory    Past Surgical History  Procedure Laterality Date  . No past surgeries     Family History  Problem Relation Age of Onset  . Hypertension Mother   . Hyperthyroidism Mother   . Diabetes Mother      Exam    Uterus:     Pelvic Exam:    Perineum: No Hemorrhoids, Normal Perineum   Vulva: normal, Bartholin's, Urethra, Skene's normal   Vagina:  normal discharge, 2 mm NT vaginal wall cyst   pH: NA   Cervix: multiparous appearance, no bleeding following Pap and no cervical motion tenderness   Adnexa: normal adnexa and no mass, fullness, tenderness   Bony Pelvis: average and proven to 7+  System: Breast:  Declined   Skin: normal coloration and turgor, no rashes    Neurologic: oriented, normal mood, grossly non-focal   Extremities: normal strength, tone, and muscle mass   HEENT sclera clear, anicteric and thyroid without masses   Mouth/Teeth mucous membranes moist, pharynx normal without lesions and dental hygiene good   Neck no masses   Cardiovascular: regular rate and rhythm, no murmurs or gallops   Respiratory:  appears well, vitals normal, no respiratory distress, acyanotic, normal RR, chest clear,  no wheezing, crepitations, rhonchi, normal symmetric air entry   Abdomen: soft, non-tender; bowel sounds normal; no masses,  no organomegaly   Urinary: urethral meatus normal      Assessment:    Pregnancy: G2P1001 Patient Active Problem List   Diagnosis Date Noted  . Supervision of normal pregnancy 06/06/2014       Plan:     Initial labs drawn. Prenatal vitamins. Problem list reviewed and updated. Genetic Screening discussed Quad Screen: declined.  Ultrasound discussed; fetal survey: ordered.  Follow up in 4 weeks.  Dorathy KinsmanSMITH, Nyriah Coote 06/06/2014

## 2014-06-06 NOTE — Patient Instructions (Signed)
Second Trimester of Pregnancy The second trimester is from week 13 through week 28, months 4 through 6. The second trimester is often a time when you feel your best. Your body has also adjusted to being pregnant, and you begin to feel better physically. Usually, morning sickness has lessened or quit completely, you may have more energy, and you may have an increase in appetite. The second trimester is also a time when the fetus is growing rapidly. At the end of the sixth month, the fetus is about 9 inches long and weighs about 1 pounds. You will likely begin to feel the baby move (quickening) between 18 and 20 weeks of the pregnancy. BODY CHANGES Your body goes through many changes during pregnancy. The changes vary from woman to woman.   Your weight will continue to increase. You will notice your lower abdomen bulging out.  You may begin to get stretch marks on your hips, abdomen, and breasts.  You may develop headaches that can be relieved by medicines approved by your health care provider.  You may urinate more often because the fetus is pressing on your bladder.  You may develop or continue to have heartburn as a result of your pregnancy.  You may develop constipation because certain hormones are causing the muscles that push waste through your intestines to slow down.  You may develop hemorrhoids or swollen, bulging veins (varicose veins).  You may have back pain because of the weight gain and pregnancy hormones relaxing your joints between the bones in your pelvis and as a result of a shift in weight and the muscles that support your balance.  Your breasts will continue to grow and be tender.  Your gums may bleed and may be sensitive to brushing and flossing.  Dark spots or blotches (chloasma, mask of pregnancy) may develop on your face. This will likely fade after the baby is born.  A dark line from your belly button to the pubic area (linea nigra) may appear. This will likely fade  after the baby is born.  You may have changes in your hair. These can include thickening of your hair, rapid growth, and changes in texture. Some women also have hair loss during or after pregnancy, or hair that feels dry or thin. Your hair will most likely return to normal after your baby is born. WHAT TO EXPECT AT YOUR PRENATAL VISITS During a routine prenatal visit:  You will be weighed to make sure you and the fetus are growing normally.  Your blood pressure will be taken.  Your abdomen will be measured to track your baby's growth.  The fetal heartbeat will be listened to.  Any test results from the previous visit will be discussed. Your health care provider may ask you:  How you are feeling.  If you are feeling the baby move.  If you have had any abnormal symptoms, such as leaking fluid, bleeding, severe headaches, or abdominal cramping.  If you have any questions. Other tests that may be performed during your second trimester include:  Blood tests that check for:  Low iron levels (anemia).  Gestational diabetes (between 24 and 28 weeks).  Rh antibodies.  Urine tests to check for infections, diabetes, or protein in the urine.  An ultrasound to confirm the proper growth and development of the baby.  An amniocentesis to check for possible genetic problems.  Fetal screens for spina bifida and Down syndrome. HOME CARE INSTRUCTIONS   Avoid all smoking, herbs, alcohol, and unprescribed   drugs. These chemicals affect the formation and growth of the baby.  Follow your health care provider's instructions regarding medicine use. There are medicines that are either safe or unsafe to take during pregnancy.  Exercise only as directed by your health care provider. Experiencing uterine cramps is a good sign to stop exercising.  Continue to eat regular, healthy meals.  Wear a good support bra for breast tenderness.  Do not use hot tubs, steam rooms, or saunas.  Wear your  seat belt at all times when driving.  Avoid raw meat, uncooked cheese, cat litter boxes, and soil used by cats. These carry germs that can cause birth defects in the baby.  Take your prenatal vitamins.  Try taking a stool softener (if your health care provider approves) if you develop constipation. Eat more high-fiber foods, such as fresh vegetables or fruit and whole grains. Drink plenty of fluids to keep your urine clear or pale yellow.  Take warm sitz baths to soothe any pain or discomfort caused by hemorrhoids. Use hemorrhoid cream if your health care provider approves.  If you develop varicose veins, wear support hose. Elevate your feet for 15 minutes, 3-4 times a day. Limit salt in your diet.  Avoid heavy lifting, wear low heel shoes, and practice good posture.  Rest with your legs elevated if you have leg cramps or low back pain.  Visit your dentist if you have not gone yet during your pregnancy. Use a soft toothbrush to brush your teeth and be gentle when you floss.  A sexual relationship may be continued unless your health care provider directs you otherwise.  Continue to go to all your prenatal visits as directed by your health care provider. SEEK MEDICAL CARE IF:   You have dizziness.  You have mild pelvic cramps, pelvic pressure, or nagging pain in the abdominal area.  You have persistent nausea, vomiting, or diarrhea.  You have a bad smelling vaginal discharge.  You have pain with urination. SEEK IMMEDIATE MEDICAL CARE IF:   You have a fever.  You are leaking fluid from your vagina.  You have spotting or bleeding from your vagina.  You have severe abdominal cramping or pain.  You have rapid weight gain or loss.  You have shortness of breath with chest pain.  You notice sudden or extreme swelling of your face, hands, ankles, feet, or legs.  You have not felt your baby move in over an hour.  You have severe headaches that do not go away with  medicine.  You have vision changes. Document Released: 01/27/2001 Document Revised: 02/07/2013 Document Reviewed: 04/05/2012 ExitCare Patient Information 2015 [employer]. This information is not intended to replace advice given to you by your health care provider. Make sure you discuss any questions you have with your health care provider.  

## 2014-06-06 NOTE — Progress Notes (Signed)
Early glucola given due to maternal mother having diabetes  New ob packet given Declined flu vaccine  Pt has decided not to do a [employer] Screen

## 2014-06-07 LAB — CYTOLOGY - PAP

## 2014-06-07 LAB — GLUCOSE TOLERANCE, 1 HOUR (50G) W/O FASTING: Glucose, 1 Hour GTT: 125 mg/dL (ref 70–140)

## 2014-06-08 ENCOUNTER — Ambulatory Visit (HOSPITAL_COMMUNITY)
Admission: RE | Admit: 2014-06-08 | Discharge: 2014-06-08 | Disposition: A | Discharge status: Home / Self Care | Payer: Medicaid Other | Source: Ambulatory Visit | Attending: Advanced Practice Midwife | Admitting: Advanced Practice Midwife

## 2014-06-08 DIAGNOSIS — Z3A20 20 weeks gestation of pregnancy: Secondary | ICD-10-CM | POA: Insufficient documentation

## 2014-06-08 DIAGNOSIS — Z3482 Encounter for supervision of other normal pregnancy, second trimester: Secondary | ICD-10-CM | POA: Insufficient documentation

## 2014-06-08 DIAGNOSIS — Z3689 Encounter for other specified antenatal screening: Secondary | ICD-10-CM | POA: Insufficient documentation

## 2014-06-08 DIAGNOSIS — Z3492 Encounter for supervision of normal pregnancy, unspecified, second trimester: Secondary | ICD-10-CM

## 2014-06-08 LAB — PRENATAL PROFILE (SOLSTAS)
ANTIBODY SCREEN: NEGATIVE
BASOS ABS: 0 10*3/uL (ref 0.0–0.1)
Basophils Relative: 0 % (ref 0–1)
EOS ABS: 0 10*3/uL (ref 0.0–0.7)
EOS PCT: 0 % (ref 0–5)
HCT: 35.3 % — ABNORMAL LOW (ref 36.0–46.0)
HIV 1&2 Ab, 4th Generation: NONREACTIVE
Hemoglobin: 12 g/dL (ref 12.0–15.0)
Hepatitis B Surface Ag: NEGATIVE
LYMPHS ABS: 1.4 10*3/uL (ref 0.7–4.0)
Lymphocytes Relative: 17 % (ref 12–46)
MCH: 29.3 pg (ref 26.0–34.0)
MCHC: 34 g/dL (ref 30.0–36.0)
MCV: 86.1 fL (ref 78.0–100.0)
MPV: 9.4 fL (ref 8.6–12.4)
Monocytes Absolute: 0.5 10*3/uL (ref 0.1–1.0)
Monocytes Relative: 6 % (ref 3–12)
NEUTROS PCT: 77 % (ref 43–77)
Neutro Abs: 6.3 10*3/uL (ref 1.7–7.7)
Platelets: 193 10*3/uL (ref 150–400)
RBC: 4.1 MIL/uL (ref 3.87–5.11)
RDW: 14.3 % (ref 11.5–15.5)
Rh Type: POSITIVE
Rubella: 1.4 Index — ABNORMAL HIGH (ref <0.90)
WBC: 8.2 10*3/uL (ref 4.0–10.5)

## 2014-06-08 LAB — PRESCRIPTION MONITORING PROFILE (19 PANEL)
Amphetamine/Meth: NEGATIVE ng/mL
BENZODIAZEPINE SCREEN, URINE: NEGATIVE ng/mL
Barbiturate Screen, Urine: NEGATIVE ng/mL
Buprenorphine, Urine: NEGATIVE ng/mL
CREATININE, URINE: 219.96 mg/dL (ref >20.0)
Cannabinoid Scrn, Ur: NEGATIVE ng/mL
Carisoprodol, Urine: NEGATIVE ng/mL
Cocaine Metabolites: NEGATIVE ng/mL
Fentanyl, Ur: NEGATIVE ng/mL
MDMA URINE: NEGATIVE ng/mL
Meperidine, Ur: NEGATIVE ng/mL
Methadone Screen, Urine: NEGATIVE ng/mL
Methaqualone: NEGATIVE ng/mL
NITRITES URINE, INITIAL: NEGATIVE ug/mL
OPIATE SCREEN, URINE: NEGATIVE ng/mL
OXYCODONE SCRN UR: NEGATIVE ng/mL
PH URINE, INITIAL: 6.2 pH (ref 4.5–8.9)
Phencyclidine, Ur: NEGATIVE ng/mL
Propoxyphene: NEGATIVE ng/mL
TAPENTADOLUR: NEGATIVE ng/mL
TRAMADOL UR: NEGATIVE ng/mL
ZOLPIDEM, URINE: NEGATIVE ng/mL

## 2014-06-08 LAB — CULTURE, OB URINE
COLONY COUNT: NO GROWTH
ORGANISM ID, BACTERIA: NO GROWTH

## 2014-06-12 ENCOUNTER — Encounter: Payer: Self-pay | Admitting: Obstetrics and Gynecology

## 2014-06-12 DIAGNOSIS — O09899 Supervision of other high risk pregnancies, unspecified trimester: Secondary | ICD-10-CM | POA: Insufficient documentation

## 2014-07-04 ENCOUNTER — Ambulatory Visit (INDEPENDENT_AMBULATORY_CARE_PROVIDER_SITE_OTHER): Payer: Self-pay | Admitting: Physician Assistant

## 2014-07-04 VITALS — BP 115/67 | HR 70 | Temp 98.5°F | Wt 146.1 lb

## 2014-07-04 DIAGNOSIS — O09892 Supervision of other high risk pregnancies, second trimester: Secondary | ICD-10-CM

## 2014-07-04 DIAGNOSIS — Z3492 Encounter for supervision of normal pregnancy, unspecified, second trimester: Secondary | ICD-10-CM

## 2014-07-04 LAB — POCT URINALYSIS DIP (DEVICE)
Bilirubin Urine: NEGATIVE
Glucose, UA: NEGATIVE mg/dL
Hgb urine dipstick: NEGATIVE
Ketones, ur: NEGATIVE mg/dL
NITRITE: NEGATIVE
Protein, ur: NEGATIVE mg/dL
Specific Gravity, Urine: 1.025 (ref 1.005–1.030)
Urobilinogen, UA: 0.2 mg/dL (ref 0.0–1.0)
pH: 6 (ref 5.0–8.0)

## 2014-07-04 NOTE — Progress Notes (Signed)
U/s scheduled 5/27 @ 4 for growth per MFM; small leuks on UA

## 2014-07-04 NOTE — Patient Instructions (Signed)
Second Trimester of Pregnancy The second trimester is from week 13 through week 28, months 4 through 6. The second trimester is often a time when you feel your best. Your body has also adjusted to being pregnant, and you begin to feel better physically. Usually, morning sickness has lessened or quit completely, you may have more energy, and you may have an increase in appetite. The second trimester is also a time when the fetus is growing rapidly. At the end of the sixth month, the fetus is about 9 inches long and weighs about 1 pounds. You will likely begin to feel the baby move (quickening) between 18 and 20 weeks of the pregnancy. BODY CHANGES Your body goes through many changes during pregnancy. The changes vary from woman to woman.   Your weight will continue to increase. You will notice your lower abdomen bulging out.  You may begin to get stretch marks on your hips, abdomen, and breasts.  You may develop headaches that can be relieved by medicines approved by your health care provider.  You may urinate more often because the fetus is pressing on your bladder.  You may develop or continue to have heartburn as a result of your pregnancy.  You may develop constipation because certain hormones are causing the muscles that push waste through your intestines to slow down.  You may develop hemorrhoids or swollen, bulging veins (varicose veins).  You may have back pain because of the weight gain and pregnancy hormones relaxing your joints between the bones in your pelvis and as a result of a shift in weight and the muscles that support your balance.  Your breasts will continue to grow and be tender.  Your gums may bleed and may be sensitive to brushing and flossing.  Dark spots or blotches (chloasma, mask of pregnancy) may develop on your face. This will likely fade after the baby is born.  A dark line from your belly button to the pubic area (linea nigra) may appear. This will likely fade  after the baby is born.  You may have changes in your hair. These can include thickening of your hair, rapid growth, and changes in texture. Some women also have hair loss during or after pregnancy, or hair that feels dry or thin. Your hair will most likely return to normal after your baby is born. WHAT TO EXPECT AT YOUR PRENATAL VISITS During a routine prenatal visit:  You will be weighed to make sure you and the fetus are growing normally.  Your blood pressure will be taken.  Your abdomen will be measured to track your baby's growth.  The fetal heartbeat will be listened to.  Any test results from the previous visit will be discussed. Your health care provider may ask you:  How you are feeling.  If you are feeling the baby move.  If you have had any abnormal symptoms, such as leaking fluid, bleeding, severe headaches, or abdominal cramping.  If you have any questions. Other tests that may be performed during your second trimester include:  Blood tests that check for:  Low iron levels (anemia).  Gestational diabetes (between 24 and 28 weeks).  Rh antibodies.  Urine tests to check for infections, diabetes, or protein in the urine.  An ultrasound to confirm the proper growth and development of the baby.  An amniocentesis to check for possible genetic problems.  Fetal screens for spina bifida and Down syndrome. HOME CARE INSTRUCTIONS   Avoid all smoking, herbs, alcohol, and unprescribed   drugs. These chemicals affect the formation and growth of the baby.  Follow your health care provider's instructions regarding medicine use. There are medicines that are either safe or unsafe to take during pregnancy.  Exercise only as directed by your health care provider. Experiencing uterine cramps is a good sign to stop exercising.  Continue to eat regular, healthy meals.  Wear a good support bra for breast tenderness.  Do not use hot tubs, steam rooms, or saunas.  Wear your  seat belt at all times when driving.  Avoid raw meat, uncooked cheese, cat litter boxes, and soil used by cats. These carry germs that can cause birth defects in the baby.  Take your prenatal vitamins.  Try taking a stool softener (if your health care provider approves) if you develop constipation. Eat more high-fiber foods, such as fresh vegetables or fruit and whole grains. Drink plenty of fluids to keep your urine clear or pale yellow.  Take warm sitz baths to soothe any pain or discomfort caused by hemorrhoids. Use hemorrhoid cream if your health care provider approves.  If you develop varicose veins, wear support hose. Elevate your feet for 15 minutes, 3-4 times a day. Limit salt in your diet.  Avoid heavy lifting, wear low heel shoes, and practice good posture.  Rest with your legs elevated if you have leg cramps or low back pain.  Visit your dentist if you have not gone yet during your pregnancy. Use a soft toothbrush to brush your teeth and be gentle when you floss.  A sexual relationship may be continued unless your health care provider directs you otherwise.  Continue to go to all your prenatal visits as directed by your health care provider. SEEK MEDICAL CARE IF:   You have dizziness.  You have mild pelvic cramps, pelvic pressure, or nagging pain in the abdominal area.  You have persistent nausea, vomiting, or diarrhea.  You have a bad smelling vaginal discharge.  You have pain with urination. SEEK IMMEDIATE MEDICAL CARE IF:   You have a fever.  You are leaking fluid from your vagina.  You have spotting or bleeding from your vagina.  You have severe abdominal cramping or pain.  You have rapid weight gain or loss.  You have shortness of breath with chest pain.  You notice sudden or extreme swelling of your face, hands, ankles, feet, or legs.  You have not felt your baby move in over an hour.  You have severe headaches that do not go away with  medicine.  You have vision changes. Document Released: 01/27/2001 Document Revised: 02/07/2013 Document Reviewed: 04/05/2012 ExitCare Patient Information 2015 [employer]. This information is not intended to replace advice given to you by your health care provider. Make sure you discuss any questions you have with your health care provider.  

## 2014-07-04 NOTE — Progress Notes (Signed)
24 weeks, has u/s scheduled for 5/27 to f/u growth.  Denies LOF, VB, dysuria.  Endorses good FM RTC 4 weeks for 28 week visit

## 2014-07-13 ENCOUNTER — Ambulatory Visit (HOSPITAL_COMMUNITY)
Admission: RE | Admit: 2014-07-13 | Discharge: 2014-07-13 | Disposition: A | Discharge status: Home / Self Care | Payer: Medicaid Other | Source: Ambulatory Visit | Attending: Physician Assistant | Admitting: Physician Assistant

## 2014-07-13 DIAGNOSIS — O09892 Supervision of other high risk pregnancies, second trimester: Secondary | ICD-10-CM | POA: Diagnosis present

## 2014-07-17 ENCOUNTER — Encounter (HOSPITAL_COMMUNITY): Payer: Self-pay

## 2014-08-01 ENCOUNTER — Encounter: Payer: Self-pay | Admitting: *Deleted

## 2014-08-01 ENCOUNTER — Encounter: Payer: Self-pay | Admitting: Physician Assistant

## 2014-08-01 ENCOUNTER — Ambulatory Visit (INDEPENDENT_AMBULATORY_CARE_PROVIDER_SITE_OTHER): Payer: Medicaid Other | Admitting: Physician Assistant

## 2014-08-01 VITALS — BP 112/65 | HR 79 | Temp 98.4°F | Wt 153.4 lb

## 2014-08-01 DIAGNOSIS — Z23 Encounter for immunization: Secondary | ICD-10-CM | POA: Diagnosis not present

## 2014-08-01 DIAGNOSIS — Z3492 Encounter for supervision of normal pregnancy, unspecified, second trimester: Secondary | ICD-10-CM | POA: Diagnosis not present

## 2014-08-01 DIAGNOSIS — O0932 Supervision of pregnancy with insufficient antenatal care, second trimester: Secondary | ICD-10-CM

## 2014-08-01 LAB — POCT URINALYSIS DIP (DEVICE)
BILIRUBIN URINE: NEGATIVE
Glucose, UA: NEGATIVE mg/dL
HGB URINE DIPSTICK: NEGATIVE
KETONES UR: NEGATIVE mg/dL
NITRITE: NEGATIVE
Protein, ur: NEGATIVE mg/dL
Specific Gravity, Urine: 1.02 (ref 1.005–1.030)
Urobilinogen, UA: 0.2 mg/dL (ref 0.0–1.0)
pH: 6 (ref 5.0–8.0)

## 2014-08-01 LAB — CBC
HEMATOCRIT: 33.4 % — AB (ref 36.0–46.0)
Hemoglobin: 11.4 g/dL — ABNORMAL LOW (ref 12.0–15.0)
MCH: 29.5 pg (ref 26.0–34.0)
MCHC: 34.1 g/dL (ref 30.0–36.0)
MCV: 86.5 fL (ref 78.0–100.0)
MPV: 9.2 fL (ref 8.6–12.4)
Platelets: 198 10*3/uL (ref 150–400)
RBC: 3.86 MIL/uL — ABNORMAL LOW (ref 3.87–5.11)
RDW: 13.4 % (ref 11.5–15.5)
WBC: 9.1 10*3/uL (ref 4.0–10.5)

## 2014-08-01 MED ORDER — TETANUS-DIPHTH-ACELL PERTUSSIS 5-2.5-18.5 LF-MCG/0.5 IM SUSP
0.5000 mL | Freq: Once | INTRAMUSCULAR | Status: AC
  Administered 2014-08-01: 0.5 mL via INTRAMUSCULAR

## 2014-08-01 NOTE — Progress Notes (Signed)
Breastfeeding tip of the week reviewed 28 wk labs with 1 hour gtt Pt needs dental referral Home Medicaid form complete

## 2014-08-01 NOTE — Patient Instructions (Signed)
Third Trimester of Pregnancy The third trimester is from week 29 through week 42, months 7 through 9. The third trimester is a time when the fetus is growing rapidly. At the end of the ninth month, the fetus is about 20 inches in length and weighs 6-10 pounds.  BODY CHANGES Your body goes through many changes during pregnancy. The changes vary from woman to woman.   Your weight will continue to increase. You can expect to gain 25-35 pounds (11-16 kg) by the end of the pregnancy.  You may begin to get stretch marks on your hips, abdomen, and breasts.  You may urinate more often because the fetus is moving lower into your pelvis and pressing on your bladder.  You may develop or continue to have heartburn as a result of your pregnancy.  You may develop constipation because certain hormones are causing the muscles that push waste through your intestines to slow down.  You may develop hemorrhoids or swollen, bulging veins (varicose veins).  You may have pelvic pain because of the weight gain and pregnancy hormones relaxing your joints between the bones in your pelvis. Backaches may result from overexertion of the muscles supporting your posture.  You may have changes in your hair. These can include thickening of your hair, rapid growth, and changes in texture. Some women also have hair loss during or after pregnancy, or hair that feels dry or thin. Your hair will most likely return to normal after your baby is born.  Your breasts will continue to grow and be tender. A yellow discharge may leak from your breasts called colostrum.  Your belly button may stick out.  You may feel short of breath because of your expanding uterus.  You may notice the fetus "dropping," or moving lower in your abdomen.  You may have a bloody mucus discharge. This usually occurs a few days to a week before labor begins.  Your cervix becomes thin and soft (effaced) near your due date. WHAT TO EXPECT AT YOUR PRENATAL  EXAMS  You will have prenatal exams every 2 weeks until week 36. Then, you will have weekly prenatal exams. During a routine prenatal visit:  You will be weighed to make sure you and the fetus are growing normally.  Your blood pressure is taken.  Your abdomen will be measured to track your baby's growth.  The fetal heartbeat will be listened to.  Any test results from the previous visit will be discussed.  You may have a cervical check near your due date to see if you have effaced. At around 36 weeks, your caregiver will check your cervix. At the same time, your caregiver will also perform a test on the secretions of the vaginal tissue. This test is to determine if a type of bacteria, Group B streptococcus, is present. Your caregiver will explain this further. Your caregiver may ask you:  What your birth plan is.  How you are feeling.  If you are feeling the baby move.  If you have had any abnormal symptoms, such as leaking fluid, bleeding, severe headaches, or abdominal cramping.  If you have any questions. Other tests or screenings that may be performed during your third trimester include:  Blood tests that check for low iron levels (anemia).  Fetal testing to check the health, activity level, and growth of the fetus. Testing is done if you have certain medical conditions or if there are problems during the pregnancy. FALSE LABOR You may feel small, irregular contractions that   eventually go away. These are called Braxton Hicks contractions, or false labor. Contractions may last for hours, days, or even weeks before true labor sets in. If contractions come at regular intervals, intensify, or become painful, it is best to be seen by your caregiver.  SIGNS OF LABOR   Menstrual-like cramps.  Contractions that are 5 minutes apart or less.  Contractions that start on the top of the uterus and spread down to the lower abdomen and back.  A sense of increased pelvic pressure or back  pain.  A watery or bloody mucus discharge that comes from the vagina. If you have any of these signs before the 37th week of pregnancy, call your caregiver right away. You need to go to the hospital to get checked immediately. HOME CARE INSTRUCTIONS   Avoid all smoking, herbs, alcohol, and unprescribed drugs. These chemicals affect the formation and growth of the baby.  Follow your caregiver's instructions regarding medicine use. There are medicines that are either safe or unsafe to take during pregnancy.  Exercise only as directed by your caregiver. Experiencing uterine cramps is a good sign to stop exercising.  Continue to eat regular, healthy meals.  Wear a good support bra for breast tenderness.  Do not use hot tubs, steam rooms, or saunas.  Wear your seat belt at all times when driving.  Avoid raw meat, uncooked cheese, cat litter boxes, and soil used by cats. These carry germs that can cause birth defects in the baby.  Take your prenatal vitamins.  Try taking a stool softener (if your caregiver approves) if you develop constipation. Eat more high-fiber foods, such as fresh vegetables or fruit and whole grains. Drink plenty of fluids to keep your urine clear or pale yellow.  Take warm sitz baths to soothe any pain or discomfort caused by hemorrhoids. Use hemorrhoid cream if your caregiver approves.  If you develop varicose veins, wear support hose. Elevate your feet for 15 minutes, 3-4 times a day. Limit salt in your diet.  Avoid heavy lifting, wear low heal shoes, and practice good posture.  Rest a lot with your legs elevated if you have leg cramps or low back pain.  Visit your dentist if you have not gone during your pregnancy. Use a soft toothbrush to brush your teeth and be gentle when you floss.  A sexual relationship may be continued unless your caregiver directs you otherwise.  Do not travel far distances unless it is absolutely necessary and only with the approval  of your caregiver.  Take prenatal classes to understand, practice, and ask questions about the labor and delivery.  Make a trial run to the hospital.  Pack your hospital bag.  Prepare the baby's nursery.  Continue to go to all your prenatal visits as directed by your caregiver. SEEK MEDICAL CARE IF:  You are unsure if you are in labor or if your water has broken.  You have dizziness.  You have mild pelvic cramps, pelvic pressure, or nagging pain in your abdominal area.  You have persistent nausea, vomiting, or diarrhea.  You have a bad smelling vaginal discharge.  You have pain with urination. SEEK IMMEDIATE MEDICAL CARE IF:   You have a fever.  You are leaking fluid from your vagina.  You have spotting or bleeding from your vagina.  You have severe abdominal cramping or pain.  You have rapid weight loss or gain.  You have shortness of breath with chest pain.  You notice sudden or extreme swelling   of your face, hands, ankles, feet, or legs.  You have not felt your baby move in over an hour.  You have severe headaches that do not go away with medicine.  You have vision changes. Document Released: 01/27/2001 Document Revised: 02/07/2013 Document Reviewed: 04/05/2012 ExitCare Patient Information 2015 [employer]. This information is not intended to replace advice given to you by your health care provider. Make sure you discuss any questions you have with your health care provider.  

## 2014-08-01 NOTE — Progress Notes (Signed)
27 weeks, endorses good fetal movement, denies VB, LOF, dysuria.   No contractions RTC 2 weeks for U/S.

## 2014-08-02 LAB — RPR

## 2014-08-02 LAB — HIV ANTIBODY (ROUTINE TESTING W REFLEX): HIV 1&2 Ab, 4th Generation: NONREACTIVE

## 2014-08-02 LAB — GLUCOSE TOLERANCE, 1 HOUR (50G) W/O FASTING: Glucose, 1 Hour GTT: 119 mg/dL (ref 70–140)

## 2014-08-15 ENCOUNTER — Ambulatory Visit (INDEPENDENT_AMBULATORY_CARE_PROVIDER_SITE_OTHER): Payer: Medicaid Other | Admitting: Family

## 2014-08-15 VITALS — BP 119/79 | HR 85 | Temp 98.3°F | Wt 156.3 lb

## 2014-08-15 DIAGNOSIS — Z3492 Encounter for supervision of normal pregnancy, unspecified, second trimester: Secondary | ICD-10-CM

## 2014-08-15 LAB — POCT URINALYSIS DIP (DEVICE)
Bilirubin Urine: NEGATIVE
GLUCOSE, UA: NEGATIVE mg/dL
Nitrite: NEGATIVE
Protein, ur: NEGATIVE mg/dL
SPECIFIC GRAVITY, URINE: 1.025 (ref 1.005–1.030)
Urobilinogen, UA: 0.2 mg/dL (ref 0.0–1.0)
pH: 5.5 (ref 5.0–8.0)

## 2014-08-15 NOTE — Progress Notes (Signed)
Subjective:   Deanna Thornton  is a 24 y.o.  female being seen today for her obstetrical visit. She is at  8228w1d . Patient reports no complaints. Fetal movement: normal.  Menstrual History: OB History    Gravida Para Term Preterm AB TAB SAB Ectopic Multiple Living   1               The following portions of the patient's history were reviewed and updated as appropriate: allergies, current medications, past family history, past medical history, past social history, past surgical history and problem list.  Review of Systems Constitutional: negative Genitourinary:negative; pt denies vaginal bleeding or leaking of fluid.  Reports no contractions.     Objective:   Filed Vitals:   08/15/14 1036  BP: 119/79  Pulse: 85  Temp: 98.3 F (36.8 C)    FHT:  165 BPM  Uterine Size: 30 cm        Assessment:   G2P1001  at 128w1d wks   Plan:    28-week labs reviewed, normal Infant feeding: plans to breastfeed. Follow up in 2 Weeks.

## 2014-08-29 ENCOUNTER — Ambulatory Visit (INDEPENDENT_AMBULATORY_CARE_PROVIDER_SITE_OTHER): Payer: Medicaid Other | Admitting: Obstetrics and Gynecology

## 2014-08-29 VITALS — BP 120/71 | HR 83 | Temp 97.8°F | Wt 159.7 lb

## 2014-08-29 DIAGNOSIS — Z3493 Encounter for supervision of normal pregnancy, unspecified, third trimester: Secondary | ICD-10-CM

## 2014-08-29 LAB — POCT URINALYSIS DIP (DEVICE)
Bilirubin Urine: NEGATIVE
Glucose, UA: NEGATIVE mg/dL
Hgb urine dipstick: NEGATIVE
KETONES UR: NEGATIVE mg/dL
NITRITE: NEGATIVE
PH: 6 (ref 5.0–8.0)
Protein, ur: NEGATIVE mg/dL
Specific Gravity, Urine: 1.015 (ref 1.005–1.030)
Urobilinogen, UA: 0.2 mg/dL (ref 0.0–1.0)

## 2014-08-29 NOTE — Patient Instructions (Signed)
Third Trimester of Pregnancy The third trimester is from week 29 through week 42, months 7 through 9. The third trimester is a time when the fetus is growing rapidly. At the end of the ninth month, the fetus is about 20 inches in length and weighs 6-10 pounds.  BODY CHANGES Your body goes through many changes during pregnancy. The changes vary from woman to woman.   Your weight will continue to increase. You can expect to gain 25-35 pounds (11-16 kg) by the end of the pregnancy.  You may begin to get stretch marks on your hips, abdomen, and breasts.  You may urinate more often because the fetus is moving lower into your pelvis and pressing on your bladder.  You may develop or continue to have heartburn as a result of your pregnancy.  You may develop constipation because certain hormones are causing the muscles that push waste through your intestines to slow down.  You may develop hemorrhoids or swollen, bulging veins (varicose veins).  You may have pelvic pain because of the weight gain and pregnancy hormones relaxing your joints between the bones in your pelvis. Backaches may result from overexertion of the muscles supporting your posture.  You may have changes in your hair. These can include thickening of your hair, rapid growth, and changes in texture. Some women also have hair loss during or after pregnancy, or hair that feels dry or thin. Your hair will most likely return to normal after your baby is born.  Your breasts will continue to grow and be tender. A yellow discharge may leak from your breasts called colostrum.  Your belly button may stick out.  You may feel short of breath because of your expanding uterus.  You may notice the fetus "dropping," or moving lower in your abdomen.  You may have a bloody mucus discharge. This usually occurs a few days to a week before labor begins.  Your cervix becomes thin and soft (effaced) near your due date. WHAT TO EXPECT AT YOUR PRENATAL  EXAMS  You will have prenatal exams every 2 weeks until week 36. Then, you will have weekly prenatal exams. During a routine prenatal visit:  You will be weighed to make sure you and the fetus are growing normally.  Your blood pressure is taken.  Your abdomen will be measured to track your baby's growth.  The fetal heartbeat will be listened to.  Any test results from the previous visit will be discussed.  You may have a cervical check near your due date to see if you have effaced. At around 36 weeks, your caregiver will check your cervix. At the same time, your caregiver will also perform a test on the secretions of the vaginal tissue. This test is to determine if a type of bacteria, Group B streptococcus, is present. Your caregiver will explain this further. Your caregiver may ask you:  What your birth plan is.  How you are feeling.  If you are feeling the baby move.  If you have had any abnormal symptoms, such as leaking fluid, bleeding, severe headaches, or abdominal cramping.  If you have any questions. Other tests or screenings that may be performed during your third trimester include:  Blood tests that check for low iron levels (anemia).  Fetal testing to check the health, activity level, and growth of the fetus. Testing is done if you have certain medical conditions or if there are problems during the pregnancy. FALSE LABOR You may feel small, irregular contractions that   eventually go away. These are called Braxton Hicks contractions, or false labor. Contractions may last for hours, days, or even weeks before true labor sets in. If contractions come at regular intervals, intensify, or become painful, it is best to be seen by your caregiver.  SIGNS OF LABOR   Menstrual-like cramps.  Contractions that are 5 minutes apart or less.  Contractions that start on the top of the uterus and spread down to the lower abdomen and back.  A sense of increased pelvic pressure or back  pain.  A watery or bloody mucus discharge that comes from the vagina. If you have any of these signs before the 37th week of pregnancy, call your caregiver right away. You need to go to the hospital to get checked immediately. HOME CARE INSTRUCTIONS   Avoid all smoking, herbs, alcohol, and unprescribed drugs. These chemicals affect the formation and growth of the baby.  Follow your caregiver's instructions regarding medicine use. There are medicines that are either safe or unsafe to take during pregnancy.  Exercise only as directed by your caregiver. Experiencing uterine cramps is a good sign to stop exercising.  Continue to eat regular, healthy meals.  Wear a good support bra for breast tenderness.  Do not use hot tubs, steam rooms, or saunas.  Wear your seat belt at all times when driving.  Avoid raw meat, uncooked cheese, cat litter boxes, and soil used by cats. These carry germs that can cause birth defects in the baby.  Take your prenatal vitamins.  Try taking a stool softener (if your caregiver approves) if you develop constipation. Eat more high-fiber foods, such as fresh vegetables or fruit and whole grains. Drink plenty of fluids to keep your urine clear or pale yellow.  Take warm sitz baths to soothe any pain or discomfort caused by hemorrhoids. Use hemorrhoid cream if your caregiver approves.  If you develop varicose veins, wear support hose. Elevate your feet for 15 minutes, 3-4 times a day. Limit salt in your diet.  Avoid heavy lifting, wear low heal shoes, and practice good posture.  Rest a lot with your legs elevated if you have leg cramps or low back pain.  Visit your dentist if you have not gone during your pregnancy. Use a soft toothbrush to brush your teeth and be gentle when you floss.  A sexual relationship may be continued unless your caregiver directs you otherwise.  Do not travel far distances unless it is absolutely necessary and only with the approval  of your caregiver.  Take prenatal classes to understand, practice, and ask questions about the labor and delivery.  Make a trial run to the hospital.  Pack your hospital bag.  Prepare the baby's nursery.  Continue to go to all your prenatal visits as directed by your caregiver. SEEK MEDICAL CARE IF:  You are unsure if you are in labor or if your water has broken.  You have dizziness.  You have mild pelvic cramps, pelvic pressure, or nagging pain in your abdominal area.  You have persistent nausea, vomiting, or diarrhea.  You have a bad smelling vaginal discharge.  You have pain with urination. SEEK IMMEDIATE MEDICAL CARE IF:   You have a fever.  You are leaking fluid from your vagina.  You have spotting or bleeding from your vagina.  You have severe abdominal cramping or pain.  You have rapid weight loss or gain.  You have shortness of breath with chest pain.  You notice sudden or extreme swelling   of your face, hands, ankles, feet, or legs.  You have not felt your baby move in over an hour.  You have severe headaches that do not go away with medicine.  You have vision changes. Document Released: 01/27/2001 Document Revised: 02/07/2013 Document Reviewed: 04/05/2012 ExitCare Patient Information 2015 [employer]. This information is not intended to replace advice given to you by your health care provider. Make sure you discuss any questions you have with your health care provider.  

## 2014-08-29 NOTE — Progress Notes (Signed)
Pressure- lower abd 

## 2014-08-29 NOTE — Progress Notes (Signed)
Subjective:2  Deanna Thornton is a 24 y.o. G2P1001 at 3143w1d being seen today for ongoing prenatal care.  Patient reports no complaints.  Contractions: Not present.  Vag. Bleeding: None. Movement: Present. Denies leaking of fluid.   The following portions of the patient's history were reviewed and updated as appropriate: allergies, current medications, past family history, past medical history, past social history, past surgical history and problem list.   Objective:   Filed Vitals:   08/29/14 1046  BP: 120/71  Pulse: 83  Temp: 97.8 F (36.6 C)  Weight: 159 lb 11.2 oz (72.439 kg)    Fetal Status: Fetal Heart Rate (bpm): 165   Movement: Present     General:  Alert, oriented and cooperative. Patient is in no acute distress.  Skin: Skin is warm and dry. No rash noted.   Cardiovascular: Normal heart rate noted  Respiratory: Normal respiratory effort, no problems with respiration noted  Abdomen: Soft, gravid, appropriate for gestational age. Pain/Pressure: Present     Vaginal: Vag. Bleeding: None.       Cervix: Not evaluated        Extremities: Normal range of motion.  Edema: None  Mental Status: Normal mood and affect. Normal behavior. Normal judgment and thought content.   Urinalysis:      Assessment and Plan:  Pregnancy: G2P1001 at 6443w1d Doing well 1. Supervision of normal pregnancy, third trimester Reviewed all results and plans  Preterm labor symptoms and general obstetric precautions including but not limited to vaginal bleeding, contractions, leaking of fluid and fetal movement were reviewed in detail with the patient.  Please refer to After Visit Summary for other counseling recommendations.   Return in about 1 week (around 09/05/2014).   Danae Orleanseirdre C Aarya Robinson, CNM

## 2014-09-10 ENCOUNTER — Inpatient Hospital Stay (HOSPITAL_COMMUNITY): Payer: Medicaid Other

## 2014-09-10 ENCOUNTER — Encounter: Payer: Self-pay | Admitting: Obstetrics and Gynecology

## 2014-09-10 ENCOUNTER — Ambulatory Visit (INDEPENDENT_AMBULATORY_CARE_PROVIDER_SITE_OTHER): Payer: Medicaid Other | Admitting: Obstetrics and Gynecology

## 2014-09-10 ENCOUNTER — Inpatient Hospital Stay (HOSPITAL_COMMUNITY)
Admission: AD | Admit: 2014-09-10 | Discharge: 2014-09-10 | Disposition: A | Discharge status: Home / Self Care | Payer: Medicaid Other | Source: Ambulatory Visit | Attending: Obstetrics & Gynecology | Admitting: Obstetrics & Gynecology

## 2014-09-10 VITALS — BP 115/63 | HR 100 | Temp 98.7°F | Wt 160.4 lb

## 2014-09-10 DIAGNOSIS — Z3493 Encounter for supervision of normal pregnancy, unspecified, third trimester: Secondary | ICD-10-CM

## 2014-09-10 DIAGNOSIS — Z3A32 32 weeks gestation of pregnancy: Secondary | ICD-10-CM | POA: Diagnosis not present

## 2014-09-10 DIAGNOSIS — IMO0002 Reserved for concepts with insufficient information to code with codable children: Secondary | ICD-10-CM | POA: Insufficient documentation

## 2014-09-10 LAB — POCT URINALYSIS DIP (DEVICE)
BILIRUBIN URINE: NEGATIVE
GLUCOSE, UA: NEGATIVE mg/dL
Hgb urine dipstick: NEGATIVE
Ketones, ur: NEGATIVE mg/dL
NITRITE: NEGATIVE
Protein, ur: NEGATIVE mg/dL
SPECIFIC GRAVITY, URINE: 1.015 (ref 1.005–1.030)
Urobilinogen, UA: 0.2 mg/dL (ref 0.0–1.0)
pH: 7 (ref 5.0–8.0)

## 2014-09-10 NOTE — Discharge Instructions (Signed)

## 2014-09-10 NOTE — MAU Note (Signed)
Pt seen by L. Clemmons CNM, was determined she did not need monitoring in MAU after BPP.  Pt home in stable condition.

## 2014-09-10 NOTE — Progress Notes (Signed)
Subjective:  Deanna Thornton is a 25 y.o. G2P1001 at [redacted]w[redacted]d being seen today for ongoing prenatal care.  Patient reports no complaints.   Contractions: Not present.   . Movement: Present Good FM. Denies leaking of fluid.   The following portions of the patient's history were reviewed and updated as appropriate: allergies, current medications, past family history, past medical history, past social history, past surgical history and problem list.   Objective:   Filed Vitals:   09/10/14 1557  BP: 115/63  Pulse: 100  Temp: 98.7 F (37.1 C)  Weight: 160 lb 6.4 oz (72.757 kg)    Fetal Status: Fetal Heart Rate (bpm): 168   Movement: Present    Recheck FHR 180s with FM> NST in MAU  General:  Alert, oriented and cooperative. Patient is in no acute distress.  Skin: Skin is warm and dry. No rash noted.   Cardiovascular: Normal heart rate noted  Respiratory: Normal respiratory effort, no problems with respiration noted  Abdomen: Soft, gravid, appropriate for gestational age. Pain/Pressure: Absent     Vaginal:  .       Cervix: Not evaluated        Extremities: Normal range of motion.  Edema: None  Mental Status: Normal mood and affect. Normal behavior. Normal judgment and thought content.    Urinalysis:    N/N  Assessment and Plan:  Pregnancy: G2P1001 at [redacted]w[redacted]d  1. Supervision of normal pregnancy, third trimester Fetal tachycardia > NST  Preterm labor symptoms and general obstetric precautions including but not limited to vaginal bleeding, contractions, leaking of fluid and fetal movement were reviewed in detail with the patient. Please refer to After Visit Summary for other counseling recommendations.  Return in about 2 weeks (around 09/24/2014).   Danae Orleans, CNM

## 2014-09-10 NOTE — Patient Instructions (Signed)
Third Trimester of Pregnancy The third trimester is from week 29 through week 42, months 7 through 9. The third trimester is a time when the fetus is growing rapidly. At the end of the ninth month, the fetus is about 20 inches in length and weighs 6-10 pounds.  BODY CHANGES Your body goes through many changes during pregnancy. The changes vary from woman to woman.   Your weight will continue to increase. You can expect to gain 25-35 pounds (11-16 kg) by the end of the pregnancy.  You may begin to get stretch marks on your hips, abdomen, and breasts.  You may urinate more often because the fetus is moving lower into your pelvis and pressing on your bladder.  You may develop or continue to have heartburn as a result of your pregnancy.  You may develop constipation because certain hormones are causing the muscles that push waste through your intestines to slow down.  You may develop hemorrhoids or swollen, bulging veins (varicose veins).  You may have pelvic pain because of the weight gain and pregnancy hormones relaxing your joints between the bones in your pelvis. Backaches may result from overexertion of the muscles supporting your posture.  You may have changes in your hair. These can include thickening of your hair, rapid growth, and changes in texture. Some women also have hair loss during or after pregnancy, or hair that feels dry or thin. Your hair will most likely return to normal after your baby is born.  Your breasts will continue to grow and be tender. A yellow discharge may leak from your breasts called colostrum.  Your belly button may stick out.  You may feel short of breath because of your expanding uterus.  You may notice the fetus "dropping," or moving lower in your abdomen.  You may have a bloody mucus discharge. This usually occurs a few days to a week before labor begins.  Your cervix becomes thin and soft (effaced) near your due date. WHAT TO EXPECT AT YOUR PRENATAL  EXAMS  You will have prenatal exams every 2 weeks until week 36. Then, you will have weekly prenatal exams. During a routine prenatal visit:  You will be weighed to make sure you and the fetus are growing normally.  Your blood pressure is taken.  Your abdomen will be measured to track your baby's growth.  The fetal heartbeat will be listened to.  Any test results from the previous visit will be discussed.  You may have a cervical check near your due date to see if you have effaced. At around 36 weeks, your caregiver will check your cervix. At the same time, your caregiver will also perform a test on the secretions of the vaginal tissue. This test is to determine if a type of bacteria, Group B streptococcus, is present. Your caregiver will explain this further. Your caregiver may ask you:  What your birth plan is.  How you are feeling.  If you are feeling the baby move.  If you have had any abnormal symptoms, such as leaking fluid, bleeding, severe headaches, or abdominal cramping.  If you have any questions. Other tests or screenings that may be performed during your third trimester include:  Blood tests that check for low iron levels (anemia).  Fetal testing to check the health, activity level, and growth of the fetus. Testing is done if you have certain medical conditions or if there are problems during the pregnancy. FALSE LABOR You may feel small, irregular contractions that   eventually go away. These are called Braxton Hicks contractions, or false labor. Contractions may last for hours, days, or even weeks before true labor sets in. If contractions come at regular intervals, intensify, or become painful, it is best to be seen by your caregiver.  SIGNS OF LABOR   Menstrual-like cramps.  Contractions that are 5 minutes apart or less.  Contractions that start on the top of the uterus and spread down to the lower abdomen and back.  A sense of increased pelvic pressure or back  pain.  A watery or bloody mucus discharge that comes from the vagina. If you have any of these signs before the 37th week of pregnancy, call your caregiver right away. You need to go to the hospital to get checked immediately. HOME CARE INSTRUCTIONS   Avoid all smoking, herbs, alcohol, and unprescribed drugs. These chemicals affect the formation and growth of the baby.  Follow your caregiver's instructions regarding medicine use. There are medicines that are either safe or unsafe to take during pregnancy.  Exercise only as directed by your caregiver. Experiencing uterine cramps is a good sign to stop exercising.  Continue to eat regular, healthy meals.  Wear a good support bra for breast tenderness.  Do not use hot tubs, steam rooms, or saunas.  Wear your seat belt at all times when driving.  Avoid raw meat, uncooked cheese, cat litter boxes, and soil used by cats. These carry germs that can cause birth defects in the baby.  Take your prenatal vitamins.  Try taking a stool softener (if your caregiver approves) if you develop constipation. Eat more high-fiber foods, such as fresh vegetables or fruit and whole grains. Drink plenty of fluids to keep your urine clear or pale yellow.  Take warm sitz baths to soothe any pain or discomfort caused by hemorrhoids. Use hemorrhoid cream if your caregiver approves.  If you develop varicose veins, wear support hose. Elevate your feet for 15 minutes, 3-4 times a day. Limit salt in your diet.  Avoid heavy lifting, wear low heal shoes, and practice good posture.  Rest a lot with your legs elevated if you have leg cramps or low back pain.  Visit your dentist if you have not gone during your pregnancy. Use a soft toothbrush to brush your teeth and be gentle when you floss.  A sexual relationship may be continued unless your caregiver directs you otherwise.  Do not travel far distances unless it is absolutely necessary and only with the approval  of your caregiver.  Take prenatal classes to understand, practice, and ask questions about the labor and delivery.  Make a trial run to the hospital.  Pack your hospital bag.  Prepare the baby's nursery.  Continue to go to all your prenatal visits as directed by your caregiver. SEEK MEDICAL CARE IF:  You are unsure if you are in labor or if your water has broken.  You have dizziness.  You have mild pelvic cramps, pelvic pressure, or nagging pain in your abdominal area.  You have persistent nausea, vomiting, or diarrhea.  You have a bad smelling vaginal discharge.  You have pain with urination. SEEK IMMEDIATE MEDICAL CARE IF:   You have a fever.  You are leaking fluid from your vagina.  You have spotting or bleeding from your vagina.  You have severe abdominal cramping or pain.  You have rapid weight loss or gain.  You have shortness of breath with chest pain.  You notice sudden or extreme swelling   of your face, hands, ankles, feet, or legs.  You have not felt your baby move in over an hour.  You have severe headaches that do not go away with medicine.  You have vision changes. Document Released: 01/27/2001 Document Revised: 02/07/2013 Document Reviewed: 04/05/2012 ExitCare Patient Information 2015 [employer]. This information is not intended to replace advice given to you by your health care provider. Make sure you discuss any questions you have with your health care provider.  

## 2014-09-10 NOTE — MAU Provider Note (Signed)
Chief Complaint:  No chief complaint on file.   None    HPI: Deanna Thornton is a 24 y.o. G2P1001 at [redacted]w[redacted]d who presents to maternity admissions after being seen in the office and had a fhr of 168 and then 180. Sent here for further testing. Denies contractions, leakage of fluid or vaginal bleeding. Good fetal movement.   Pregnancy Course: Uneventful  Past Medical History: Past Medical History  Diagnosis Date  . Medical history non-contributory     Past obstetric history: OB History  Gravida Para Term Preterm AB SAB TAB Ectopic Multiple Living  2 1 1       1     # Outcome Date GA Lbr Len/2nd Weight Sex Delivery Anes PTL Lv  2 Current           1 Term 04/02/13 [redacted]w[redacted]d 15:31 / 00:18 3.294 kg (7 lb 4.2 oz) M Vag-Spont None  Y      Past Surgical History: Past Surgical History  Procedure Laterality Date  . No past surgeries       Family History: Family History  Problem Relation Age of Onset  . Hypertension Mother   . Hyperthyroidism Mother   . Diabetes Mother     Social History: History  Substance Use Topics  . Smoking status: Never Smoker   . Smokeless tobacco: Never Used  . Alcohol Use: No    Allergies: No Known Allergies  Meds:  Prescriptions prior to admission  Medication Sig Dispense Refill Last Dose  . Prenatal Multivit-Min-Fe-FA (PRENATAL VITAMINS) 0.8 MG tablet Take 1 tablet by mouth daily.   Taking    ROS:  Review of Systems  Constitutional: Negative for fever.  All other systems reviewed and are negative.   Physical Exam  Last menstrual period 01/14/2014, currently breastfeeding. GENERAL: Well-developed, well-nourished female in no acute distress.  HEART: normal rate RESP: normal effort GI: Abd soft, non-tender, gravid appropriate for gestational age. Pos BS x 4 MS: Extremities nontender, no edema, normal ROM NEURO: Alert and oriented x 4.  GU: NEFG, physiologic discharge, no blood, cervix clean. No CVAT      Labs: Results for orders placed  or performed in visit on 09/10/14 (from the past 24 hour(s))  POCT urinalysis dip (device)     Status: Abnormal   Collection Time: 09/10/14  4:31 PM  Result Value Ref Range   Glucose, UA NEGATIVE NEGATIVE mg/dL   Bilirubin Urine NEGATIVE NEGATIVE   Ketones, ur NEGATIVE NEGATIVE mg/dL   Specific Gravity, Urine 1.015 1.005 - 1.030   Hgb urine dipstick NEGATIVE NEGATIVE   pH 7.0 5.0 - 8.0   Protein, ur NEGATIVE NEGATIVE mg/dL   Urobilinogen, UA 0.2 0.0 - 1.0 mg/dL   Nitrite NEGATIVE NEGATIVE   Leukocytes, UA TRACE (A) NEGATIVE    Imaging:  No results found.  MAU Course: Results for orders placed or performed in visit on 09/10/14 (from the past 24 hour(s))  POCT urinalysis dip (device)     Status: Abnormal   Collection Time: 09/10/14  4:31 PM  Result Value Ref Range   Glucose, UA NEGATIVE NEGATIVE mg/dL   Bilirubin Urine NEGATIVE NEGATIVE   Ketones, ur NEGATIVE NEGATIVE mg/dL   Specific Gravity, Urine 1.015 1.005 - 1.030   Hgb urine dipstick NEGATIVE NEGATIVE   pH 7.0 5.0 - 8.0   Protein, ur NEGATIVE NEGATIVE mg/dL   Urobilinogen, UA 0.2 0.0 - 1.0 mg/dL   Nitrite NEGATIVE NEGATIVE   Leukocytes, UA TRACE (A) NEGATIVE  Bpp- 8/8 discussed POC with Dr Debroah Loop and pt will be discharge and told to schedule return visit in clinic. Assessment: 1. Fetal tachycardia     Plan: Discharge home in stable condition.  Labor precautions and fetal kick counts      Medication List    ASK your doctor about these medications        Prenatal Vitamins 0.8 MG tablet  Take 1 tablet by mouth daily.        Rhea Pink, CNM 09/10/2014 5:45 PM

## 2014-09-25 ENCOUNTER — Ambulatory Visit (INDEPENDENT_AMBULATORY_CARE_PROVIDER_SITE_OTHER): Payer: Medicaid Other | Admitting: Obstetrics and Gynecology

## 2014-09-25 VITALS — BP 111/63 | HR 83 | Temp 97.9°F | Wt 165.7 lb

## 2014-09-25 DIAGNOSIS — Z3493 Encounter for supervision of normal pregnancy, unspecified, third trimester: Secondary | ICD-10-CM

## 2014-09-25 LAB — POCT URINALYSIS DIP (DEVICE)
Bilirubin Urine: NEGATIVE
Glucose, UA: NEGATIVE mg/dL
Hgb urine dipstick: NEGATIVE
Nitrite: NEGATIVE
PROTEIN: NEGATIVE mg/dL
Specific Gravity, Urine: 1.02 (ref 1.005–1.030)
Urobilinogen, UA: 1 mg/dL (ref 0.0–1.0)
pH: 7.5 (ref 5.0–8.0)

## 2014-09-25 NOTE — Progress Notes (Signed)
Vaginal and pelvic pressure

## 2014-09-25 NOTE — Progress Notes (Signed)
Subjective:  Deanna Thornton is a 24 y.o. G2P1001 at [redacted]w[redacted]d being seen today for ongoing prenatal care.  Patient reports no complaints.  Contractions: Irritability.  Vag. Bleeding: None. Movement: Present. Denies leaking of fluid.   The following portions of the patient's history were reviewed and updated as appropriate: allergies, current medications, past family history, past medical history, past social history, past surgical history and problem list.   Objective:   Filed Vitals:   09/25/14 1516  BP: 111/63  Pulse: 83  Temp: 97.9 F (36.6 C)  Weight: 165 lb 11.2 oz (75.161 kg)    Fetal Status: Fetal Heart Rate (bpm): 142   Movement: Present     General:  Alert, oriented and cooperative. Patient is in no acute distress.  Skin: Skin is warm and dry. No rash noted.   Cardiovascular: Normal heart rate noted  Respiratory: Normal respiratory effort, no problems with respiration noted  Abdomen: Soft, gravid, appropriate for gestational age. Pain/Pressure: Present     Pelvic: Vag. Bleeding: None     Cervical exam deferred        Extremities: Normal range of motion.  Edema: None  Mental Status: Normal mood and affect. Normal behavior. Normal judgment and thought content.   Urinalysis: Urine Protein: Negative Urine Glucose: Negative  Assessment and Plan:  Pregnancy: G2P1001 at [redacted]w[redacted]d  1. Supervision of normal pregnancy, third trimester Doing well. Discussed plans and 57 month old adjustment.   Preterm labor symptoms and general obstetric precautions including but not limited to vaginal bleeding, contractions, leaking of fluid and fetal movement were reviewed in detail with the patient. Please refer to After Visit Summary for other counseling recommendations.  Return in about 1 week (around 10/02/2014).   Danae Orleans, CNM

## 2014-09-25 NOTE — Patient Instructions (Signed)
Third Trimester of Pregnancy The third trimester is from week 29 through week 42, months 7 through 9. The third trimester is a time when the fetus is growing rapidly. At the end of the ninth month, the fetus is about 20 inches in length and weighs 6-10 pounds.  BODY CHANGES Your body goes through many changes during pregnancy. The changes vary from woman to woman.   Your weight will continue to increase. You can expect to gain 25-35 pounds (11-16 kg) by the end of the pregnancy.  You may begin to get stretch marks on your hips, abdomen, and breasts.  You may urinate more often because the fetus is moving lower into your pelvis and pressing on your bladder.  You may develop or continue to have heartburn as a result of your pregnancy.  You may develop constipation because certain hormones are causing the muscles that push waste through your intestines to slow down.  You may develop hemorrhoids or swollen, bulging veins (varicose veins).  You may have pelvic pain because of the weight gain and pregnancy hormones relaxing your joints between the bones in your pelvis. Backaches may result from overexertion of the muscles supporting your posture.  You may have changes in your hair. These can include thickening of your hair, rapid growth, and changes in texture. Some women also have hair loss during or after pregnancy, or hair that feels dry or thin. Your hair will most likely return to normal after your baby is born.  Your breasts will continue to grow and be tender. A yellow discharge may leak from your breasts called colostrum.  Your belly button may stick out.  You may feel short of breath because of your expanding uterus.  You may notice the fetus "dropping," or moving lower in your abdomen.  You may have a bloody mucus discharge. This usually occurs a few days to a week before labor begins.  Your cervix becomes thin and soft (effaced) near your due date. WHAT TO EXPECT AT YOUR PRENATAL  EXAMS  You will have prenatal exams every 2 weeks until week 36. Then, you will have weekly prenatal exams. During a routine prenatal visit:  You will be weighed to make sure you and the fetus are growing normally.  Your blood pressure is taken.  Your abdomen will be measured to track your baby's growth.  The fetal heartbeat will be listened to.  Any test results from the previous visit will be discussed.  You may have a cervical check near your due date to see if you have effaced. At around 36 weeks, your caregiver will check your cervix. At the same time, your caregiver will also perform a test on the secretions of the vaginal tissue. This test is to determine if a type of bacteria, Group B streptococcus, is present. Your caregiver will explain this further. Your caregiver may ask you:  What your birth plan is.  How you are feeling.  If you are feeling the baby move.  If you have had any abnormal symptoms, such as leaking fluid, bleeding, severe headaches, or abdominal cramping.  If you have any questions. Other tests or screenings that may be performed during your third trimester include:  Blood tests that check for low iron levels (anemia).  Fetal testing to check the health, activity level, and growth of the fetus. Testing is done if you have certain medical conditions or if there are problems during the pregnancy. FALSE LABOR You may feel small, irregular contractions that   eventually go away. These are called Braxton Hicks contractions, or false labor. Contractions may last for hours, days, or even weeks before true labor sets in. If contractions come at regular intervals, intensify, or become painful, it is best to be seen by your caregiver.  SIGNS OF LABOR   Menstrual-like cramps.  Contractions that are 5 minutes apart or less.  Contractions that start on the top of the uterus and spread down to the lower abdomen and back.  A sense of increased pelvic pressure or back  pain.  A watery or bloody mucus discharge that comes from the vagina. If you have any of these signs before the 37th week of pregnancy, call your caregiver right away. You need to go to the hospital to get checked immediately. HOME CARE INSTRUCTIONS   Avoid all smoking, herbs, alcohol, and unprescribed drugs. These chemicals affect the formation and growth of the baby.  Follow your caregiver's instructions regarding medicine use. There are medicines that are either safe or unsafe to take during pregnancy.  Exercise only as directed by your caregiver. Experiencing uterine cramps is a good sign to stop exercising.  Continue to eat regular, healthy meals.  Wear a good support bra for breast tenderness.  Do not use hot tubs, steam rooms, or saunas.  Wear your seat belt at all times when driving.  Avoid raw meat, uncooked cheese, cat litter boxes, and soil used by cats. These carry germs that can cause birth defects in the baby.  Take your prenatal vitamins.  Try taking a stool softener (if your caregiver approves) if you develop constipation. Eat more high-fiber foods, such as fresh vegetables or fruit and whole grains. Drink plenty of fluids to keep your urine clear or pale yellow.  Take warm sitz baths to soothe any pain or discomfort caused by hemorrhoids. Use hemorrhoid cream if your caregiver approves.  If you develop varicose veins, wear support hose. Elevate your feet for 15 minutes, 3-4 times a day. Limit salt in your diet.  Avoid heavy lifting, wear low heal shoes, and practice good posture.  Rest a lot with your legs elevated if you have leg cramps or low back pain.  Visit your dentist if you have not gone during your pregnancy. Use a soft toothbrush to brush your teeth and be gentle when you floss.  A sexual relationship may be continued unless your caregiver directs you otherwise.  Do not travel far distances unless it is absolutely necessary and only with the approval  of your caregiver.  Take prenatal classes to understand, practice, and ask questions about the labor and delivery.  Make a trial run to the hospital.  Pack your hospital bag.  Prepare the baby's nursery.  Continue to go to all your prenatal visits as directed by your caregiver. SEEK MEDICAL CARE IF:  You are unsure if you are in labor or if your water has broken.  You have dizziness.  You have mild pelvic cramps, pelvic pressure, or nagging pain in your abdominal area.  You have persistent nausea, vomiting, or diarrhea.  You have a bad smelling vaginal discharge.  You have pain with urination. SEEK IMMEDIATE MEDICAL CARE IF:   You have a fever.  You are leaking fluid from your vagina.  You have spotting or bleeding from your vagina.  You have severe abdominal cramping or pain.  You have rapid weight loss or gain.  You have shortness of breath with chest pain.  You notice sudden or extreme swelling   of your face, hands, ankles, feet, or legs.  You have not felt your baby move in over an hour.  You have severe headaches that do not go away with medicine.  You have vision changes. Document Released: 01/27/2001 Document Revised: 02/07/2013 Document Reviewed: 04/05/2012 ExitCare Patient Information 2015 [employer]. This information is not intended to replace advice given to you by your health care provider. Make sure you discuss any questions you have with your health care provider.  

## 2014-10-08 ENCOUNTER — Ambulatory Visit (INDEPENDENT_AMBULATORY_CARE_PROVIDER_SITE_OTHER): Payer: Medicaid Other | Admitting: Obstetrics and Gynecology

## 2014-10-08 ENCOUNTER — Other Ambulatory Visit (HOSPITAL_COMMUNITY)
Admission: RE | Admit: 2014-10-08 | Discharge: 2014-10-08 | Disposition: A | Discharge status: Home / Self Care | Payer: Medicaid Other | Source: Ambulatory Visit | Attending: Obstetrics and Gynecology | Admitting: Obstetrics and Gynecology

## 2014-10-08 VITALS — BP 113/67 | HR 79 | Temp 98.7°F | Wt 163.6 lb

## 2014-10-08 DIAGNOSIS — Z113 Encounter for screening for infections with a predominantly sexual mode of transmission: Secondary | ICD-10-CM | POA: Diagnosis present

## 2014-10-08 DIAGNOSIS — Z3493 Encounter for supervision of normal pregnancy, unspecified, third trimester: Secondary | ICD-10-CM | POA: Diagnosis present

## 2014-10-08 LAB — POCT URINALYSIS DIP (DEVICE)
BILIRUBIN URINE: NEGATIVE
Glucose, UA: NEGATIVE mg/dL
Hgb urine dipstick: NEGATIVE
KETONES UR: NEGATIVE mg/dL
Nitrite: NEGATIVE
PH: 6 (ref 5.0–8.0)
PROTEIN: 30 mg/dL — AB
Urobilinogen, UA: 1 mg/dL (ref 0.0–1.0)

## 2014-10-08 LAB — OB RESULTS CONSOLE GBS: GBS: NEGATIVE

## 2014-10-08 LAB — OB RESULTS CONSOLE GC/CHLAMYDIA: Gonorrhea: NEGATIVE

## 2014-10-08 NOTE — Progress Notes (Signed)
Subjective:  Deanna Thornton is a 24 y.o. G2P1001 at 110w6d being seen today for ongoing prenatal care.  Patient reports no complaints.  Contractions: Not present.  Vag. Bleeding: None. Movement: Present. Denies leaking of fluid.   The following portions of the patient's history were reviewed and updated as appropriate: allergies, current medications, past family history, past medical history, past social history, past surgical history and problem list.   Objective:   Filed Vitals:   10/08/14 1538  BP: 113/67  Pulse: 79  Temp: 98.7 F (37.1 C)  Weight: 163 lb 9.6 oz (74.208 kg)    Fetal Status: Fetal Heart Rate (bpm): 161   Movement: Present     General:  Alert, oriented and cooperative. Patient is in no acute distress.  Skin: Skin is warm and dry. No rash noted.   Cardiovascular: Normal heart rate noted  Respiratory: Normal respiratory effort, no problems with respiration noted  Abdomen: Soft, gravid, appropriate for gestational age. Pain/Pressure: Present     Pelvic: Vag. Bleeding: None Vag D/C Character: Mucous   Cervical exam deferred        Extremities: Normal range of motion.  Edema: None  Mental Status: Normal mood and affect. Normal behavior. Normal judgment and thought content.   Urinalysis: Urine Protein: 1+ Urine Glucose: Negative  Assessment and Plan:  Pregnancy: G2P1001 at [redacted]w[redacted]d  1. Prenatal care in third trimester - Culture, beta strep (group b only) - GC/Chlamydia probe amp ([place])not at [facility] - declined flu vaccine; I strongly encouraged and explained why, she said she will consider at next visit in one week  Preterm labor symptoms and general obstetric precautions including but not limited to vaginal bleeding, contractions, leaking of fluid and fetal movement were reviewed in detail with the patient. Please refer to After Visit Summary for other counseling recommendations.   F/u one week  Kathrynn Running, MD

## 2014-10-08 NOTE — Progress Notes (Signed)
Breastfeeding tip of the week reviewed Declined Flu vaccine at this time Cultures today Leukocytes: trace

## 2014-10-09 LAB — GC/CHLAMYDIA PROBE AMP (~~LOC~~) NOT AT ARMC
Chlamydia: NEGATIVE
Neisseria Gonorrhea: NEGATIVE

## 2014-10-10 LAB — CULTURE, BETA STREP (GROUP B ONLY)

## 2014-10-15 ENCOUNTER — Ambulatory Visit (INDEPENDENT_AMBULATORY_CARE_PROVIDER_SITE_OTHER): Payer: Medicaid Other | Admitting: Certified Nurse Midwife

## 2014-10-15 VITALS — BP 115/71 | HR 99 | Temp 97.4°F | Wt 165.7 lb

## 2014-10-15 DIAGNOSIS — Z23 Encounter for immunization: Secondary | ICD-10-CM | POA: Diagnosis not present

## 2014-10-15 DIAGNOSIS — O09899 Supervision of other high risk pregnancies, unspecified trimester: Secondary | ICD-10-CM

## 2014-10-15 DIAGNOSIS — O09893 Supervision of other high risk pregnancies, third trimester: Secondary | ICD-10-CM | POA: Diagnosis not present

## 2014-10-15 DIAGNOSIS — Z3493 Encounter for supervision of normal pregnancy, unspecified, third trimester: Secondary | ICD-10-CM

## 2014-10-15 LAB — POCT URINALYSIS DIP (DEVICE)
Bilirubin Urine: NEGATIVE
Glucose, UA: 100 mg/dL — AB
Hgb urine dipstick: NEGATIVE
Ketones, ur: NEGATIVE mg/dL
Nitrite: NEGATIVE
Protein, ur: NEGATIVE mg/dL
Specific Gravity, Urine: <=1.005 (ref 1.005–1.030)
Urobilinogen, UA: 0.2 mg/dL (ref 0.0–1.0)
pH: 6 (ref 5.0–8.0)

## 2014-10-15 NOTE — Patient Instructions (Signed)
Third Trimester of Pregnancy The third trimester is from week 29 through week 42, months 7 through 9. The third trimester is a time when the fetus is growing rapidly. At the end of the ninth month, the fetus is about 20 inches in length and weighs 6-10 pounds.  BODY CHANGES Your body goes through many changes during pregnancy. The changes vary from woman to woman.   Your weight will continue to increase. You can expect to gain 25-35 pounds (11-16 kg) by the end of the pregnancy.  You may begin to get stretch marks on your hips, abdomen, and breasts.  You may urinate more often because the fetus is moving lower into your pelvis and pressing on your bladder.  You may develop or continue to have heartburn as a result of your pregnancy.  You may develop constipation because certain hormones are causing the muscles that push waste through your intestines to slow down.  You may develop hemorrhoids or swollen, bulging veins (varicose veins).  You may have pelvic pain because of the weight gain and pregnancy hormones relaxing your joints between the bones in your pelvis. Backaches may result from overexertion of the muscles supporting your posture.  You may have changes in your hair. These can include thickening of your hair, rapid growth, and changes in texture. Some women also have hair loss during or after pregnancy, or hair that feels dry or thin. Your hair will most likely return to normal after your baby is born.  Your breasts will continue to grow and be tender. A yellow discharge may leak from your breasts called colostrum.  Your belly button may stick out.  You may feel short of breath because of your expanding uterus.  You may notice the fetus "dropping," or moving lower in your abdomen.  You may have a bloody mucus discharge. This usually occurs a few days to a week before labor begins.  Your cervix becomes thin and soft (effaced) near your due date. WHAT TO EXPECT AT YOUR PRENATAL  EXAMS  You will have prenatal exams every 2 weeks until week 36. Then, you will have weekly prenatal exams. During a routine prenatal visit:  You will be weighed to make sure you and the fetus are growing normally.  Your blood pressure is taken.  Your abdomen will be measured to track your baby's growth.  The fetal heartbeat will be listened to.  Any test results from the previous visit will be discussed.  You may have a cervical check near your due date to see if you have effaced. At around 36 weeks, your caregiver will check your cervix. At the same time, your caregiver will also perform a test on the secretions of the vaginal tissue. This test is to determine if a type of bacteria, Group B streptococcus, is present. Your caregiver will explain this further. Your caregiver may ask you:  What your birth plan is.  How you are feeling.  If you are feeling the baby move.  If you have had any abnormal symptoms, such as leaking fluid, bleeding, severe headaches, or abdominal cramping.  If you have any questions. Other tests or screenings that may be performed during your third trimester include:  Blood tests that check for low iron levels (anemia).  Fetal testing to check the health, activity level, and growth of the fetus. Testing is done if you have certain medical conditions or if there are problems during the pregnancy. FALSE LABOR You may feel small, irregular contractions that   eventually go away. These are called Braxton Hicks contractions, or false labor. Contractions may last for hours, days, or even weeks before true labor sets in. If contractions come at regular intervals, intensify, or become painful, it is best to be seen by your caregiver.  SIGNS OF LABOR   Menstrual-like cramps.  Contractions that are 5 minutes apart or less.  Contractions that start on the top of the uterus and spread down to the lower abdomen and back.  A sense of increased pelvic pressure or back  pain.  A watery or bloody mucus discharge that comes from the vagina. If you have any of these signs before the 37th week of pregnancy, call your caregiver right away. You need to go to the hospital to get checked immediately. HOME CARE INSTRUCTIONS   Avoid all smoking, herbs, alcohol, and unprescribed drugs. These chemicals affect the formation and growth of the baby.  Follow your caregiver's instructions regarding medicine use. There are medicines that are either safe or unsafe to take during pregnancy.  Exercise only as directed by your caregiver. Experiencing uterine cramps is a good sign to stop exercising.  Continue to eat regular, healthy meals.  Wear a good support bra for breast tenderness.  Do not use hot tubs, steam rooms, or saunas.  Wear your seat belt at all times when driving.  Avoid raw meat, uncooked cheese, cat litter boxes, and soil used by cats. These carry germs that can cause birth defects in the baby.  Take your prenatal vitamins.  Try taking a stool softener (if your caregiver approves) if you develop constipation. Eat more high-fiber foods, such as fresh vegetables or fruit and whole grains. Drink plenty of fluids to keep your urine clear or pale yellow.  Take warm sitz baths to soothe any pain or discomfort caused by hemorrhoids. Use hemorrhoid cream if your caregiver approves.  If you develop varicose veins, wear support hose. Elevate your feet for 15 minutes, 3-4 times a day. Limit salt in your diet.  Avoid heavy lifting, wear low heal shoes, and practice good posture.  Rest a lot with your legs elevated if you have leg cramps or low back pain.  Visit your dentist if you have not gone during your pregnancy. Use a soft toothbrush to brush your teeth and be gentle when you floss.  A sexual relationship may be continued unless your caregiver directs you otherwise.  Do not travel far distances unless it is absolutely necessary and only with the approval  of your caregiver.  Take prenatal classes to understand, practice, and ask questions about the labor and delivery.  Make a trial run to the hospital.  Pack your hospital bag.  Prepare the baby's nursery.  Continue to go to all your prenatal visits as directed by your caregiver. SEEK MEDICAL CARE IF:  You are unsure if you are in labor or if your water has broken.  You have dizziness.  You have mild pelvic cramps, pelvic pressure, or nagging pain in your abdominal area.  You have persistent nausea, vomiting, or diarrhea.  You have a bad smelling vaginal discharge.  You have pain with urination. SEEK IMMEDIATE MEDICAL CARE IF:   You have a fever.  You are leaking fluid from your vagina.  You have spotting or bleeding from your vagina.  You have severe abdominal cramping or pain.  You have rapid weight loss or gain.  You have shortness of breath with chest pain.  You notice sudden or extreme swelling   of your face, hands, ankles, feet, or legs.  You have not felt your baby move in over an hour.  You have severe headaches that do not go away with medicine.  You have vision changes. Document Released: 01/27/2001 Document Revised: 02/07/2013 Document Reviewed: 04/05/2012 ExitCare Patient Information 2015 [employer]. This information is not intended to replace advice given to you by your health care provider. Make sure you discuss any questions you have with your health care provider.  

## 2014-10-15 NOTE — Progress Notes (Signed)
Subjective:  Deanna Thornton is a 24 y.o. G2P1001 at [redacted]w[redacted]d being seen today for ongoing prenatal care.  Patient reports no complaints.  Contractions: Irritability.  Vag. Bleeding: None. Movement: Present. Denies leaking of fluid.   The following portions of the patient's history were reviewed and updated as appropriate: allergies, current medications, past family history, past medical history, past social history, past surgical history and problem list.   Objective:   Filed Vitals:   10/15/14 1601  BP: 115/71  Pulse: 99  Temp: 97.4 F (36.3 C)  Weight: 165 lb 11.2 oz (75.161 kg)    Fetal Status: Fetal Heart Rate (bpm): 138   Movement: Present     General:  Alert, oriented and cooperative. Patient is in no acute distress.  Skin: Skin is warm and dry. No rash noted.   Cardiovascular: Normal heart rate noted  Respiratory: Normal respiratory effort, no problems with respiration noted  Abdomen: Soft, gravid, appropriate for gestational age. Pain/Pressure: Present     Pelvic: Vag. Bleeding: None     Cervical exam deferred        Extremities: Normal range of motion.  Edema: None  Mental Status: Normal mood and affect. Normal behavior. Normal judgment and thought content.   Urinalysis: Urine Protein: 1+ Urine Glucose: Negative  Assessment and Plan:  Pregnancy: G2P1001 at [redacted]w[redacted]d  1. Short interval between pregnancies affecting pregnancy, antepartum  - Flu Vaccine QUAD 36+ mos IM; Standing - Flu Vaccine QUAD 36+ mos IM  2. Supervision of normal pregnancy, third trimester   Term labor symptoms and general obstetric precautions including but not limited to vaginal bleeding, contractions, leaking of fluid and fetal movement were reviewed in detail with the patient. Please refer to After Visit Summary for other counseling recommendations.  No Follow-up on file.   Rhea Pink, CNM

## 2014-10-23 ENCOUNTER — Ambulatory Visit (INDEPENDENT_AMBULATORY_CARE_PROVIDER_SITE_OTHER): Payer: Medicaid Other | Admitting: Family Medicine

## 2014-10-23 VITALS — BP 117/76 | HR 78 | Temp 98.2°F | Wt 168.1 lb

## 2014-10-23 DIAGNOSIS — Z3493 Encounter for supervision of normal pregnancy, unspecified, third trimester: Secondary | ICD-10-CM

## 2014-10-23 DIAGNOSIS — O09899 Supervision of other high risk pregnancies, unspecified trimester: Secondary | ICD-10-CM

## 2014-10-23 DIAGNOSIS — O09893 Supervision of other high risk pregnancies, third trimester: Secondary | ICD-10-CM

## 2014-10-23 LAB — POCT URINALYSIS DIP (DEVICE)
Bilirubin Urine: NEGATIVE
GLUCOSE, UA: NEGATIVE mg/dL
Hgb urine dipstick: NEGATIVE
Ketones, ur: NEGATIVE mg/dL
NITRITE: NEGATIVE
PROTEIN: NEGATIVE mg/dL
Specific Gravity, Urine: 1.015 (ref 1.005–1.030)
UROBILINOGEN UA: 1 mg/dL (ref 0.0–1.0)
pH: 7 (ref 5.0–8.0)

## 2014-10-23 NOTE — Patient Instructions (Signed)

## 2014-10-23 NOTE — Progress Notes (Signed)
Subjective:  Deanna Thornton is a 24 y.o. G2P1001 at [redacted]w[redacted]d being seen today for ongoing prenatal care.  Patient reports no complaints.  Contractions: Irritability.  Vag. Bleeding: None. Movement: Present. Denies leaking of fluid.   Feeling well but ready for pregnancy to be over. She reports going into SOL with last pregnancy 18 months prio. SOL at ~41 weeks The following portions of the patient's history were reviewed and updated as appropriate: allergies, current medications, past family history, past medical history, past social history, past surgical history and problem list.   Objective:   Filed Vitals:   10/23/14 1516  BP: 117/76  Pulse: 78  Temp: 98.2 F (36.8 C)  Weight: 168 lb 1.6 oz (76.25 kg)    Fetal Status: Fetal Heart Rate (bpm): 149   Movement: Present     General:  Alert, oriented and cooperative. Patient is in no acute distress.  Skin: Skin is warm and dry. No rash noted.   Cardiovascular: Normal heart rate noted  Respiratory: Normal respiratory effort, no problems with respiration noted  Abdomen: Soft, gravid, appropriate for gestational age. Pain/Pressure: Present     Pelvic: Vag. Bleeding: None     Cervical exam deferred        Extremities: Normal range of motion.  Edema: None  Mental Status: Normal mood and affect. Normal behavior. Normal judgment and thought content.   Urinalysis: Urine Protein: Negative Urine Glucose: Negative  Assessment and Plan:  Pregnancy: G2P1001 at [redacted]w[redacted]d  1. Supervision of normal pregnancy, third trimester -updated pregnancy box and reviewed problem list - Discussed sweeping membranes at next visit - IOL at 41wk  2. Short interval between pregnancies affecting pregnancy, antepartum  Term labor symptoms and general obstetric precautions including but not limited to vaginal bleeding, contractions, leaking of fluid and fetal movement were reviewed in detail with the patient. Please refer to After Visit Summary for other counseling  recommendations.   Return in about 1 week (around 10/30/2014) for Routine prenatal care.   Federico Flake, MD

## 2014-10-29 ENCOUNTER — Encounter (HOSPITAL_COMMUNITY): Payer: Self-pay

## 2014-10-29 ENCOUNTER — Inpatient Hospital Stay (HOSPITAL_COMMUNITY)
Admission: AD | Admit: 2014-10-29 | Discharge: 2014-10-31 | DRG: 775 | Disposition: A | Discharge status: Home / Self Care | Payer: Medicaid Other | Source: Ambulatory Visit | Attending: Family Medicine | Admitting: Family Medicine

## 2014-10-29 ENCOUNTER — Ambulatory Visit (INDEPENDENT_AMBULATORY_CARE_PROVIDER_SITE_OTHER): Payer: Medicaid Other | Admitting: Obstetrics & Gynecology

## 2014-10-29 VITALS — BP 119/71 | HR 97 | Temp 98.9°F | Wt 167.9 lb

## 2014-10-29 DIAGNOSIS — IMO0001 Reserved for inherently not codable concepts without codable children: Secondary | ICD-10-CM

## 2014-10-29 DIAGNOSIS — Z8249 Family history of ischemic heart disease and other diseases of the circulatory system: Secondary | ICD-10-CM

## 2014-10-29 DIAGNOSIS — Z833 Family history of diabetes mellitus: Secondary | ICD-10-CM

## 2014-10-29 DIAGNOSIS — Z8349 Family history of other endocrine, nutritional and metabolic diseases: Secondary | ICD-10-CM | POA: Diagnosis not present

## 2014-10-29 DIAGNOSIS — Z3A39 39 weeks gestation of pregnancy: Secondary | ICD-10-CM | POA: Diagnosis present

## 2014-10-29 DIAGNOSIS — Z3493 Encounter for supervision of normal pregnancy, unspecified, third trimester: Secondary | ICD-10-CM | POA: Diagnosis present

## 2014-10-29 LAB — POCT URINALYSIS DIP (DEVICE)
Bilirubin Urine: NEGATIVE
Glucose, UA: NEGATIVE mg/dL
KETONES UR: NEGATIVE mg/dL
Leukocytes, UA: NEGATIVE
Nitrite: NEGATIVE
PH: 5.5 (ref 5.0–8.0)
PROTEIN: NEGATIVE mg/dL
SPECIFIC GRAVITY, URINE: 1.015 (ref 1.005–1.030)
UROBILINOGEN UA: 1 mg/dL (ref 0.0–1.0)

## 2014-10-29 MED ORDER — ZOLPIDEM TARTRATE 5 MG PO TABS: 5.0000 mg | ORAL_TABLET | Freq: Every evening | ORAL | Status: DC | PRN

## 2014-10-29 MED ORDER — DIBUCAINE 1 % RE OINT: 1.0000 "application " | TOPICAL_OINTMENT | RECTAL | Status: DC | PRN

## 2014-10-29 MED ORDER — SIMETHICONE 80 MG PO CHEW: 80.0000 mg | CHEWABLE_TABLET | ORAL | Status: DC | PRN

## 2014-10-29 MED ORDER — ONDANSETRON HCL 4 MG/2ML IJ SOLN: 4.0000 mg | INTRAMUSCULAR | Status: DC | PRN

## 2014-10-29 MED ORDER — DIPHENHYDRAMINE HCL 25 MG PO CAPS: 25.0000 mg | ORAL_CAPSULE | Freq: Four times a day (QID) | ORAL | Status: DC | PRN

## 2014-10-29 MED ORDER — BENZOCAINE-MENTHOL 20-0.5 % EX AERO: 1.0000 "application " | INHALATION_SPRAY | CUTANEOUS | Status: DC | PRN

## 2014-10-29 MED ORDER — LIDOCAINE HCL (PF) 1 % IJ SOLN
INTRAMUSCULAR | Status: AC
  Filled 2014-10-29: qty 30

## 2014-10-29 MED ORDER — PRENATAL MULTIVITAMIN CH
1.0000 | ORAL_TABLET | Freq: Every day | ORAL | Status: DC
  Administered 2014-10-30 – 2014-10-31 (×2): 1 via ORAL
  Filled 2014-10-29 (×2): qty 1

## 2014-10-29 MED ORDER — OXYCODONE-ACETAMINOPHEN 5-325 MG PO TABS: 2.0000 | ORAL_TABLET | ORAL | Status: DC | PRN

## 2014-10-29 MED ORDER — OXYTOCIN 40 UNITS IN LACTATED RINGERS INFUSION - SIMPLE MED
INTRAVENOUS | Status: AC
  Administered 2014-10-29: 40 [IU]
  Filled 2014-10-29: qty 1000

## 2014-10-29 MED ORDER — TETANUS-DIPHTH-ACELL PERTUSSIS 5-2.5-18.5 LF-MCG/0.5 IM SUSP: 0.5000 mL | Freq: Once | INTRAMUSCULAR | Status: DC

## 2014-10-29 MED ORDER — ACETAMINOPHEN 325 MG PO TABS: 650.0000 mg | ORAL_TABLET | ORAL | Status: DC | PRN

## 2014-10-29 MED ORDER — IBUPROFEN 600 MG PO TABS
600.0000 mg | ORAL_TABLET | Freq: Four times a day (QID) | ORAL | Status: DC
  Administered 2014-10-29 – 2014-10-31 (×7): 600 mg via ORAL
  Filled 2014-10-29 (×7): qty 1

## 2014-10-29 MED ORDER — WITCH HAZEL-GLYCERIN EX PADS: 1.0000 "application " | MEDICATED_PAD | CUTANEOUS | Status: DC | PRN

## 2014-10-29 MED ORDER — ONDANSETRON HCL 4 MG PO TABS: 4.0000 mg | ORAL_TABLET | ORAL | Status: DC | PRN

## 2014-10-29 MED ORDER — OXYCODONE-ACETAMINOPHEN 5-325 MG PO TABS: 1.0000 | ORAL_TABLET | ORAL | Status: DC | PRN

## 2014-10-29 MED ORDER — LANOLIN HYDROUS EX OINT
TOPICAL_OINTMENT | CUTANEOUS | Status: DC | PRN
Start: 2014-10-29 — End: 2014-10-31

## 2014-10-29 MED ORDER — SENNOSIDES-DOCUSATE SODIUM 8.6-50 MG PO TABS
2.0000 | ORAL_TABLET | ORAL | Status: DC
  Administered 2014-10-29 – 2014-10-30 (×2): 2 via ORAL
  Filled 2014-10-29 (×2): qty 2

## 2014-10-29 NOTE — MAU Note (Signed)
Pt presented to MAU complete with a bulging bag. Transported to 167 with provider x 2 .

## 2014-10-29 NOTE — Progress Notes (Signed)
Breastfeeding tip of the week reviewed. 

## 2014-10-29 NOTE — Progress Notes (Signed)
Delivery of live viable female by Dr. Natale Milch at [phone].

## 2014-10-29 NOTE — Patient Instructions (Signed)

## 2014-10-29 NOTE — Progress Notes (Signed)
Subjective:Deanna Thornton is a 24 y.o. G2P1001 at [redacted]w[redacted]d being seen today for ongoing prenatal care.  Patient reports occasional contractions.  Contractions: Irritability.  Vag. Bleeding: None. Movement: Present. Denies leaking of fluid.   The following portions of the patient's history were reviewed and updated as appropriate: allergies, current medications, past family history, past medical history, past social history, past surgical history and problem list.   Objective:   Filed Vitals:   10/29/14 1130  BP: 119/71  Pulse: 97  Temp: 98.9 F (37.2 C)  Weight: 167 lb 14.4 oz (76.159 kg)    Fetal Status: Fetal Heart Rate (bpm): 156 Fundal Height: 38 cm Movement: Present     General:  Alert, oriented and cooperative. Patient is in no acute distress.  Skin: Skin is warm and dry. No rash noted.   Cardiovascular: Normal heart rate noted  Respiratory: Normal respiratory effort, no problems with respiration noted  Abdomen: Soft, gravid, appropriate for gestational age. Pain/Pressure: Present     Pelvic: Vag. Bleeding: None     Cervical exam performed        Extremities: Normal range of motion.  Edema: None  Mental Status: Normal mood and affect. Normal behavior. Normal judgment and thought content.   Urinalysis:      Assessment and Plan:  Pregnancy: G2P1001 at [redacted]w[redacted]d  There are no diagnoses linked to this encounter. Term labor symptoms and general obstetric precautions including but not limited to vaginal bleeding, contractions, leaking of fluid and fetal movement were reviewed in detail with the patient. Please refer to After Visit Summary for other counseling recommendations.  Return in about 4 days (around 11/02/2014).   Adam Phenix, MD

## 2014-10-30 ENCOUNTER — Encounter (HOSPITAL_COMMUNITY): Payer: Self-pay

## 2014-10-30 DIAGNOSIS — IMO0001 Reserved for inherently not codable concepts without codable children: Secondary | ICD-10-CM

## 2014-10-30 NOTE — Lactation Note (Signed)
This note was copied from the chart of Deanna Layni Kreamer. Lactation Consultation Note Experienced BF mom BF for 7 months until her milk dried up while pregnant. Has small breast w/no areola and good everted nipples. Hand expression w/no colostrum noted. Mom states the baby has been at the breast constantly and she doesn't think she has anything yet. Mom has generalized edema to lowered extremeties.  Encouraged mom to use pillows for support during BF. Educated about newborn behavior, cluster feeding, I&O, supply and demand. Mom encouraged to do skin-to-skin.Referred to Baby and Me Book in Breastfeeding section Pg. 22-23 for position options and Proper latch demonstration.[facility] brochure given w/resources, support groups and LC services.  Patient Name: Deanna Thornton [id number] Date: 10/30/2014 Reason for consult: Initial assessment   Maternal Data Has patient been taught Hand Expression?: Yes Does the patient have breastfeeding experience prior to this delivery?: Yes  Feeding Feeding Type: Breast Fed Length of feed: 30 min  LATCH Score/Interventions Latch: Grasps breast easily, tongue down, lips flanged, rhythmical sucking.  Audible Swallowing: None Intervention(s): Skin to skin;Hand expression  Type of Nipple: Everted at rest and after stimulation  Comfort (Breast/Nipple): Soft / non-tender     Hold (Positioning): No assistance needed to correctly position infant at breast.  LATCH Score: 8  Lactation Tools Discussed/Used     Consult Status Consult Status: Follow-up Date: 10/31/14 Follow-up type: In-patient    Kelyse Pask, Diamond Nickel 10/30/2014, 1:14 AM

## 2014-10-30 NOTE — Progress Notes (Signed)
Post Partum Day 1 Subjective: no complaints, up ad lib, voiding, tolerating PO and + flatus  Objective: Blood pressure 111/61, pulse 84, temperature 98 F (36.7 C), temperature source Oral, resp. rate 18, weight 167 lb (75.751 kg), last menstrual period 01/14/2014, SpO2 98 %, unknown if currently breastfeeding.  Physical Exam:  General: alert, cooperative and no distress Lochia: appropriate Uterine Fundus: firm DVT Evaluation: No evidence of DVT seen on physical exam. Negative Homan's sign. No cords or calf tenderness. No significant calf/ankle edema.  No results for input(s): HGB, HCT in the last 72 hours.  Assessment/Plan: Plan for discharge tomorrow, Breastfeeding and Lactation consult   LOS: 1 day   Deanna Thornton 10/30/2014, 7:29 AM

## 2014-10-31 LAB — RPR: RPR Ser Ql: NONREACTIVE

## 2014-10-31 MED ORDER — IBUPROFEN 600 MG PO TABS: 600.0000 mg | ORAL_TABLET | Freq: Four times a day (QID) | ORAL | Status: DC

## 2014-10-31 MED ORDER — NORETHINDRONE 0.35 MG PO TABS: 1.0000 | ORAL_TABLET | Freq: Every day | ORAL | Status: DC

## 2014-10-31 NOTE — Discharge Instructions (Signed)
Norethindrone tablets (contraception) What is this medicine? NORETHINDRONE (nor eth IN drone) is an oral contraceptive. The product contains a female hormone known as a progestin. It is used to prevent pregnancy. This medicine may be used for other purposes; ask your health care provider or pharmacist if you have questions. COMMON BRAND NAME(S): Camila, Deblitane 28-Day, Errin, Heather, [place], [place], [place], Nor-QD, Nora-BE, Norlyroc, Ortho Micronor, [employer] 28-Day What should I tell my health care provider before I take this medicine? They need to know if you have any of these conditions: -blood vessel disease or blood clots -breast, cervical, or vaginal cancer -diabetes -heart disease -kidney disease -liver disease -mental depression -migraine -seizures -stroke -vaginal bleeding -an unusual or allergic reaction to norethindrone, other medicines, foods, dyes, or preservatives -pregnant or trying to get pregnant -breast-feeding How should I use this medicine? Take this medicine by mouth with a glass of water. You may take it with or without food. Follow the directions on the prescription label. Take this medicine at the same time each day and in the order directed on the package. Do not take your medicine more often than directed. Contact your pediatrician regarding the use of this medicine in children. Special care may be needed. This medicine has been used in female children who have started having menstrual periods. A patient package insert for the product will be given with each prescription and refill. Read this sheet carefully each time. The sheet may change frequently. Overdosage: If you think you have taken too much of this medicine contact a poison control center or emergency room at once. NOTE: This medicine is only for you. Do not share this medicine with others. What if I miss a dose? Try not to miss a dose. Every time you miss a dose or take a dose late your chance of  pregnancy increases. When 1 pill is missed (even if only 3 hours late), take the missed pill as soon as possible and continue taking a pill each day at the regular time (use a back up method of birth control for the next 48 hours). If more than 1 dose is missed, use an additional birth control method for the rest of your pill pack until menses occurs. Contact your health care professional if more than 1 dose has been missed. What may interact with this medicine? Do not take this medicine with any of the following medications: -amprenavir or fosamprenavir -bosentan This medicine may also interact with the following medications: -antibiotics or medicines for infections, especially rifampin, rifabutin, rifapentine, and griseofulvin, and possibly penicillins or tetracyclines -aprepitant -barbiturate medicines, such as phenobarbital -carbamazepine -felbamate -modafinil -oxcarbazepine -phenytoin -ritonavir or other medicines for HIV infection or AIDS -St. John's wort -topiramate This list may not describe all possible interactions. Give your health care provider a list of all the medicines, herbs, non-prescription drugs, or dietary supplements you use. Also tell them if you smoke, drink alcohol, or use illegal drugs. Some items may interact with your medicine. What should I watch for while using this medicine? Visit your doctor or health care professional for regular checks on your progress. You will need a regular breast and pelvic exam and Pap smear while on this medicine. Use an additional method of birth control during the first cycle that you take these tablets. If you have any reason to think you are pregnant, stop taking this medicine right away and contact your doctor or health care professional. If you are taking this medicine for hormone related problems, it  may take several cycles of use to see improvement in your condition. This medicine does not protect you against HIV infection (AIDS)  or any other sexually transmitted diseases. What side effects may I notice from receiving this medicine? Side effects that you should report to your doctor or health care professional as soon as possible: -breast tenderness or discharge -pain in the abdomen, chest, groin or leg -severe headache -skin rash, itching, or hives -sudden shortness of breath -unusually weak or tired -vision or speech problems -yellowing of skin or eyes Side effects that usually do not require medical attention (report to your doctor or health care professional if they continue or are bothersome): -changes in sexual desire -change in menstrual flow -facial hair growth -fluid retention and swelling -headache -irritability -nausea -weight gain or loss This list may not describe all possible side effects. Call your doctor for medical advice about side effects. You may report side effects to FDA at 1-800-FDA-1088. Where should I keep my medicine? Keep out of the reach of children. Store at room temperature between 15 and 30 degrees C (59 and 86 degrees F). Throw away any unused medicine after the expiration date. NOTE: This sheet is a summary. It may not cover all possible information. If you have questions about this medicine, talk to your doctor, pharmacist, or health care provider.  2015, Elsevier/Gold Standard. (2011-10-23 16:41:35) Postpartum Care After Vaginal Delivery After you deliver your newborn (postpartum period), the usual stay in the hospital is 24-72 hours. If there were problems with your labor or delivery, or if you have other medical problems, you might be in the hospital longer.  While you are in the hospital, you will receive help and instructions on how to care for yourself and your newborn during the postpartum period.  While you are in the hospital:  Be sure to tell your nurses if you have pain or discomfort, as well as where you feel the pain and what makes the pain worse.  If you had an  incision made near your vagina (episiotomy) or if you had some tearing during delivery, the nurses may put ice packs on your episiotomy or tear. The ice packs may help to reduce the pain and swelling.  If you are breastfeeding, you may feel uncomfortable contractions of your uterus for a couple of weeks. This is normal. The contractions help your uterus get back to normal size.  It is normal to have some bleeding after delivery.  For the first 1-3 days after delivery, the flow is red and the amount may be similar to a period.  It is common for the flow to start and stop.  In the first few days, you may pass some small clots. Let your nurses know if you begin to pass large clots or your flow increases.  Do not  flush blood clots down the toilet before having the nurse look at them.  During the next 3-10 days after delivery, your flow should become more watery and pink or brown-tinged in color.  Ten to fourteen days after delivery, your flow should be a small amount of yellowish-white discharge.  The amount of your flow will decrease over the first few weeks after delivery. Your flow may stop in 6-8 weeks. Most women have had their flow stop by 12 weeks after delivery.  You should change your sanitary pads frequently.  Wash your hands thoroughly with soap and water for at least 20 seconds after changing pads, using the toilet, or before  holding or feeding your newborn.  You should feel like you need to empty your bladder within the first 6-8 hours after delivery.  In case you become weak, lightheaded, or faint, call your nurse before you get out of bed for the first time and before you take a shower for the first time.  Within the first few days after delivery, your breasts may begin to feel tender and full. This is called engorgement. Breast tenderness usually goes away within 48-72 hours after engorgement occurs. You may also notice milk leaking from your breasts. If you are not  breastfeeding, do not stimulate your breasts. Breast stimulation can make your breasts produce more milk.  Spending as much time as possible with your newborn is very important. During this time, you and your newborn can feel close and get to know each other. Having your newborn stay in your room (rooming in) will help to strengthen the bond with your newborn. It will give you time to get to know your newborn and become comfortable caring for your newborn.  Your hormones change after delivery. Sometimes the hormone changes can temporarily cause you to feel sad or tearful. These feelings should not last more than a few days. If these feelings last longer than that, you should talk to your caregiver.  If desired, talk to your caregiver about methods of family planning or contraception.  Talk to your caregiver about immunizations. Your caregiver may want you to have the following immunizations before leaving the hospital:  Tetanus, diphtheria, and pertussis (Tdap) or tetanus and diphtheria (Td) immunization. It is very important that you and your family (including grandparents) or others caring for your newborn are up-to-date with the Tdap or Td immunizations. The Tdap or Td immunization can help protect your newborn from getting ill.  Rubella immunization.  Varicella (chickenpox) immunization.  Influenza immunization. You should receive this annual immunization if you did not receive the immunization during your pregnancy. Document Released: 11/30/2006 Document Revised: 10/28/2011 Document Reviewed: 09/30/2011 [facility] Patient Information 2015 [place], [place]. This information is not intended to replace advice given to you by your health care provider. Make sure you discuss any questions you have with your health care provider.

## 2014-10-31 NOTE — H&P (Signed)
Deanna Thornton is a 24 y.o. female presenting for Active Labor with bulging membranes. She was transferred to [facility] by MAU staff immediately.   Maternal Medical History:  Reason for admission: Contractions.  Nausea.  Contractions: Onset was 1-2 hours ago.   Frequency: regular.   Perceived severity is strong.    Fetal activity: Perceived fetal activity is normal.   Last perceived fetal movement was within the past hour.    Prenatal complications: Bleeding.   No PIH, infection, IUGR (but there was SGA ), pre-eclampsia, preterm labor or substance abuse.   Prenatal Complications - Diabetes: none.    OB History    Gravida Para Term Preterm AB TAB SAB Ectopic Multiple Living   0 2     Past Medical History  Diagnosis Date  . Medical history non-contributory    Past Surgical History  Procedure Laterality Date  . No past surgeries     Family History: family history includes Diabetes in her mother; Hypertension in her mother; Hyperthyroidism in her mother. Social History:  reports that she has never smoked. She has never used smokeless tobacco. She reports that she does not drink alcohol or use illicit drugs.   Prenatal Transfer Tool  Maternal Diabetes: No Genetic Screening: Declined Maternal Ultrasounds/Referrals: Normal SGA but no IUGR Fetal Ultrasounds or other Referrals:  None Maternal Substance Abuse:  No Significant Maternal Medications:  None Significant Maternal Lab Results:  Lab values include: Group B Strep negative Other Comments:  None  Review of Systems  Constitutional: Negative for fever, chills and malaise/fatigue.  Gastrointestinal: Positive for abdominal pain. Negative for nausea, vomiting, diarrhea and constipation.  Genitourinary: Negative for dysuria.  Musculoskeletal: Positive for back pain. Negative for myalgias.    Dilation: 10 Effacement (%): 100 Station: +3 Exam by:: Dr. Natale Milch Blood pressure 106/65, pulse 76, temperature  98.4 F (36.9 C), temperature source Oral, resp. rate 16, weight 167 lb (75.751 kg), last menstrual period 01/14/2014, SpO2 99 %, unknown if currently breastfeeding. Maternal Exam:  Uterine Assessment: Contraction strength is firm.  Contraction frequency is regular.   Abdomen: Patient reports no abdominal tenderness. Fundal height is 36.   Estimated fetal weight is 6.   Fetal presentation: vertex  Introitus: Normal vulva. Vagina is positive for vaginal discharge.  Ferning test: not done.  Nitrazine test: not done. Amniotic fluid character: not assessed.  Pelvis: adequate for delivery.   Cervix: Cervix evaluated by digital exam.     Fetal Exam Fetal Monitor Review: Mode: ultrasound.   Baseline rate: 140.  Variability: moderate (6-25 bpm).   Pattern: accelerations present and no decelerations.    Fetal State Assessment: Category I - tracings are normal.     Physical Exam  Constitutional: She is oriented to person, place, and time. She appears well-developed and well-nourished. She appears distressed (with labor).  HENT:  Head: Normocephalic.  Cardiovascular: Normal rate, regular rhythm and normal heart sounds.   Respiratory: Effort normal and breath sounds normal. No respiratory distress. She has no wheezes. She has no rales.  GI: Soft. She exhibits no distension. There is no tenderness. There is no rebound and no guarding.  Genitourinary: Vaginal discharge found.  Cervix 10/100/+2/vertex  Musculoskeletal: Normal range of motion.  Neurological: She is alert and oriented to person, place, and time.  Skin: Skin is warm and dry.  Psychiatric: She has a normal mood and affect.    Prenatal labs: ABO, Rh: B/POS/-- (04/20 1037) Antibody:  NEG (04/20 1037) Rubella: 1.40 (04/20 1037) RPR: Non Reactive (09/12 1930)  HBsAg: NEGATIVE (04/20 1037)  HIV: NONREACTIVE (06/15 1444)  GBS: Negative (08/22 0000)   Assessment/Plan: A:  SIUP at [redacted]w[redacted]d       Active Labor, second stage        SROM upon admission to [facility], clear fluid       GBS Neg  P;  Admit to [facility]       Routine orders       Prepare for delivery  [facility] 10/31/2014, 2:19 PM

## 2014-10-31 NOTE — Discharge Summary (Signed)
Physician Discharge Summary  Patient ID: TARANEH METHENEY MRN: [id number] DOB/AGE: 1991-02-02 24 y.o.  Admit date: 10/29/2014 Discharge date: 10/31/2014  Admission Diagnoses: Spontaneous onset of labor  Discharge Diagnoses:  Principal Problem:   Status post vaginal delivery  Discharged Condition: good  Hospital Course:   Delivery Note At 7:42 PM a viable female was delivered via Vaginal, Spontaneous Delivery (Presentation: Left Occiput Anterior). APGAR: 9, 10; weight pending.  Placenta status: Intact, Spontaneous. Cord: 3 vessels with the following complications: None.   Anesthesia: None  Episiotomy: None Lacerations: 1st degree Suture Repair: 3.0 vicryl Est. Blood Loss (mL): 300  Mom to postpartum. Baby to Couplet care / Skin to Skin.  RAEGYN RENDA is a 24 y.o. [phone] s/p NSVD.  Patient was admitted for SOL.  She has postpartum course that was uncomplicated including no problems with ambulating, PO intake, urination, pain, or bleeding. The pt feels ready to go home and  will be discharged with outpatient follow-up.   Today: No acute events overnight.  Pt denies problems with ambulating, voiding or po intake.  She denies nausea or vomiting.  Pain is well controlled.  She has had flatus. She has had bowel movement.  Lochia Minimal.  Plan for birth control is  oral progesterone-only contraceptive.  Method of Feeding: Breast.  Discharge Exam: Blood pressure 106/65, pulse 76, temperature 98.4 F (36.9 C), temperature source Oral, resp. rate 16, weight 167 lb (75.751 kg), last menstrual period 01/14/2014, SpO2 99 %, unknown if currently breastfeeding. Physical Exam:  General: alert, cooperative and no distress Lochia: appropriate Uterine Fundus: firm DVT Evaluation: No evidence of DVT seen on physical exam.  Disposition: 01-Home or Self Care  Discharge Instructions    Activity as tolerated    Complete by:  As directed      Call MD for:  difficulty breathing, headache or  visual disturbances    Complete by:  As directed      Call MD for:  extreme fatigue    Complete by:  As directed      Call MD for:  hives    Complete by:  As directed      Call MD for:  persistant dizziness or light-headedness    Complete by:  As directed      Call MD for:  persistant nausea and vomiting    Complete by:  As directed      Call MD for:  severe uncontrolled pain    Complete by:  As directed      Call MD for:  temperature >100.4    Complete by:  As directed      Driving restriction     Complete by:  As directed   Avoid driving for at least 2 weeks.     Lifting restrictions    Complete by:  As directed   Weight restriction of 15 lbs.     Sexual acrtivity    Complete by:  As directed   Pelvic rest for six weeks (no sex or tampons).            Medication List    TAKE these medications        ibuprofen 600 MG tablet  Commonly known as:  ADVIL,MOTRIN  Take 1 tablet (600 mg total) by mouth every 6 (six) hours.     norethindrone 0.35 MG tablet  Commonly known as:  ORTHO MICRONOR  Take 1 tablet (0.35 mg total) by mouth daily.     Prenatal Vitamins 0.8  MG tablet  Take 1 tablet by mouth daily.           Follow-up Information    Follow up with [facility]. Schedule an appointment as soon as possible for a visit in 6 weeks.   Why:  For hospital follow-up   Contact information:   [address] [place] [address] [phone]      Signed: Tarri Abernethy, MD PGY-1 [facility] 10/31/2014, 7:36 AM  OB FELLOW DISCHARGE ATTESTATION  I have seen and examined this patient and agree with above documentation in the resident's note.   SHAQUILLA KEHRES is a 24 y.o. [phone] s/p nsvd.   Pain is well controlled.   PE:  BP 106/65 mmHg  Pulse 76  Temp(Src) 98.4 F (36.9 C) (Oral)  Resp 16  Wt 167 lb (75.751 kg)  SpO2 99%  LMP 01/14/2014 (Approximate)  Breastfeeding? Unknown Fundus firm  No results for  input(s): HGB, HCT in the last 72 hours.   Plan: discharge today - postpartum care discussed - f/u clinic in 6 weeks for postpartum visit - POP   Silvano Bilis, MD 7:47 AM

## 2014-10-31 NOTE — Lactation Note (Signed)
This note was copied from the chart of Deanna Thornton. Lactation Consultation Note  P2, Ex BF.  Mother states this baby is latching well. Denies problems or questions.  States she has tender nipples.  Provided comfort gels. Baby sleeping STS on mother's chest. Reviewed engorgement care and supply & demand. Encouraged breastfeeding before giving formula to help establish milk supply.  Patient Name: Deanna Chala Gul ONGEX'B Date: 10/31/2014     Maternal Data    Feeding Feeding Type: Formula Nipple Type: Slow - flow Length of feed: 25 min  LATCH Score/Interventions                      Lactation Tools Discussed/Used     Consult Status      Hardie Pulley 10/31/2014, 8:45 AM

## 2014-11-02 ENCOUNTER — Other Ambulatory Visit: Payer: Medicaid Other

## 2014-11-07 ENCOUNTER — Encounter: Payer: Medicaid Other | Admitting: Obstetrics and Gynecology

## 2014-12-06 ENCOUNTER — Ambulatory Visit (INDEPENDENT_AMBULATORY_CARE_PROVIDER_SITE_OTHER): Payer: Medicaid Other | Admitting: Family

## 2014-12-06 MED ORDER — NORGESTIMATE-ETH ESTRADIOL 0.25-35 MG-MCG PO TABS: 1.0000 | ORAL_TABLET | Freq: Every day | ORAL | Status: DC

## 2014-12-06 NOTE — Progress Notes (Signed)
Patient ID: Deanna Thornton, female   DOB: 10/29/1990, 24 y.o.   MRN: [id number] Postpartum Visit Patient is here for a postpartum visit. She is 5 weeks postpartum following a spontaneous vaginal delivery. I have fully reviewed the prenatal and intrapartum course. The delivery was at 39 gestational weeks. Outcome: spontaneous vaginal delivery. Anesthesia: none.  Postpartum course has been unremarkable. Baby's course has been unremarkable.  Doing well with both boys. Baby is feeding by breast. Bleeding no bleeding.  Bled x 3 wks.   Bowel function is normal. Bladder function is normal. Patient is not sexually active. Contraception method is none. Desires OCPs.  Postpartum depression screening: negative.  ROS:  Pertinent info in HPI  Exam:  General:  alert, cooperative and appears stated age   Breasts:  inspection negative, no nipple discharge or bleeding, no masses or nodularity palpable  Lungs: clear to auscultation bilaterally  Heart:  regular rate and rhythm, S1, S2 normal, no murmur, click, rub or gallop  Abdomen: soft, non-tender; bowel sounds normal; no masses,  no organomegaly  Pelvic exam not indicated - not bleeding, no pain at vaginal area   A/P:  Normal Postpartum Exam  RX Sprintec; begin Sunday back up method x 2 weeks; Discussed other family planning methods,patient declined.   Follow/up prn  Marlis EdelsonWalidah N Karim, CNM

## 2016-07-27 IMAGING — US US OB FOLLOW-UP
1 of 2 series · 13 of 28 positions shown · non-contrast
Comparison: none

[Series 1: us ob follow up · 37 acquisitions, 13 frames shown]
[im 1/37]
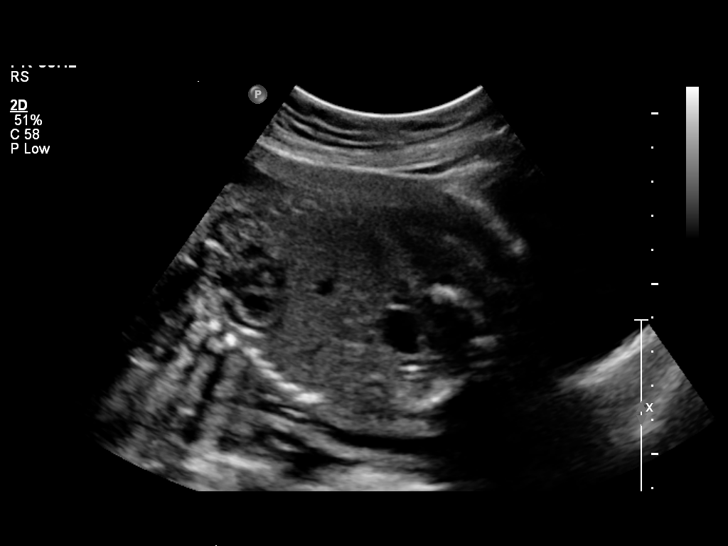
[im 3/37]
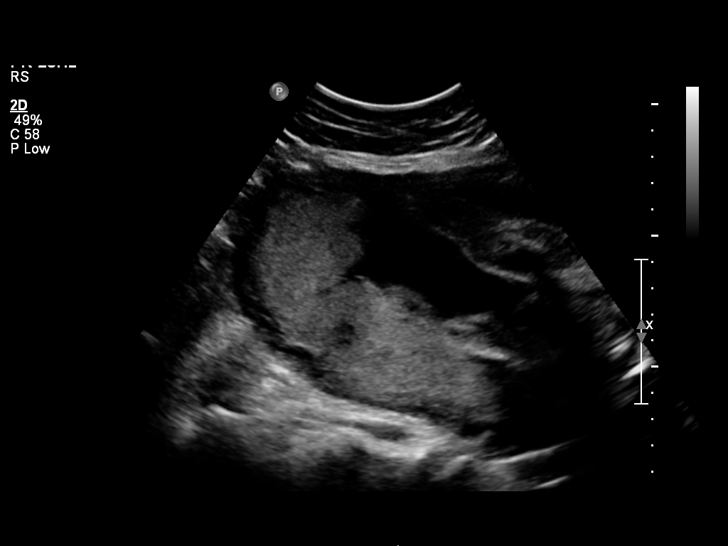
[im 6/37]
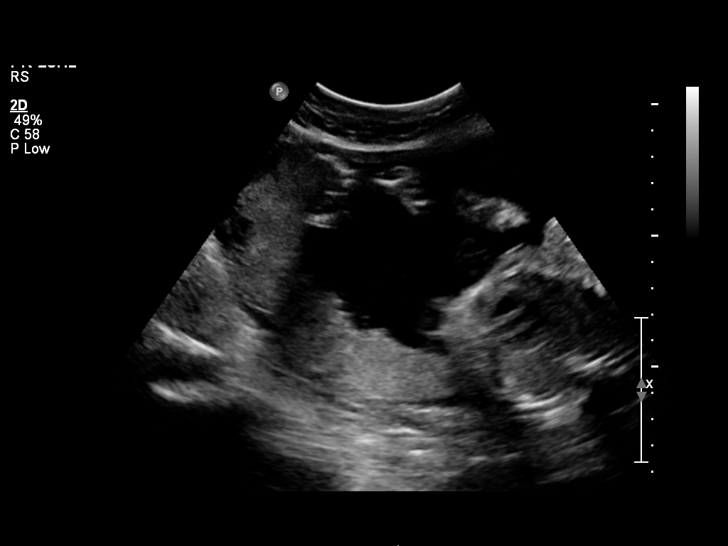
[im 9/37]
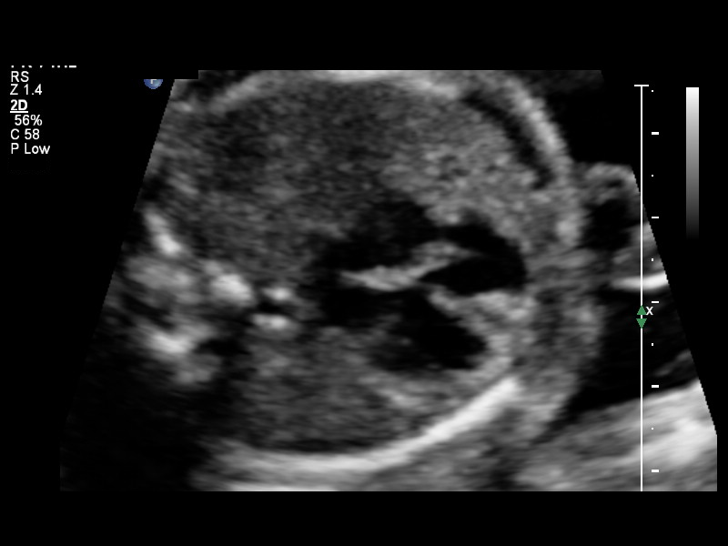
[im 12/37]
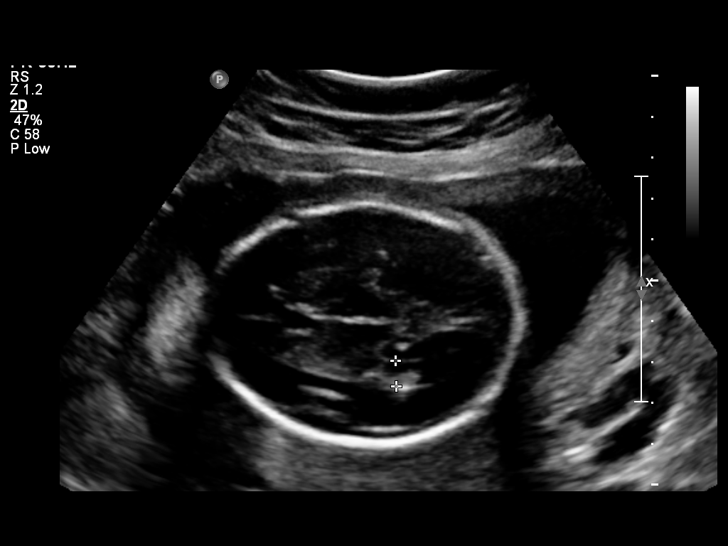
[im 14/37]
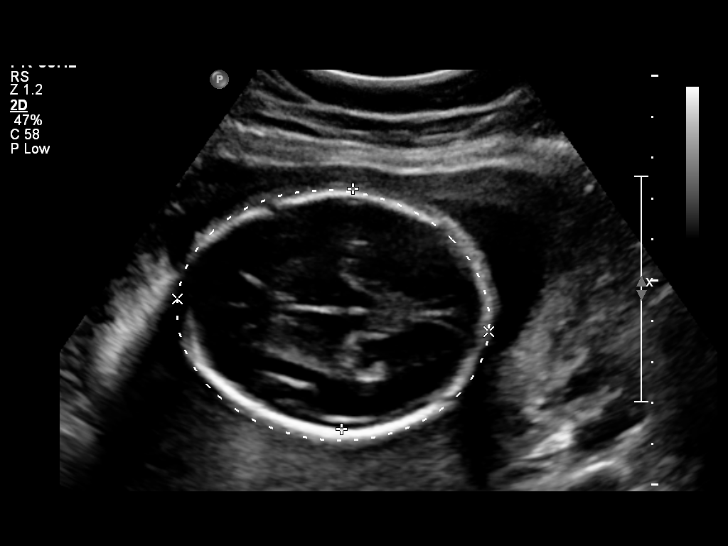
[im 19/37]
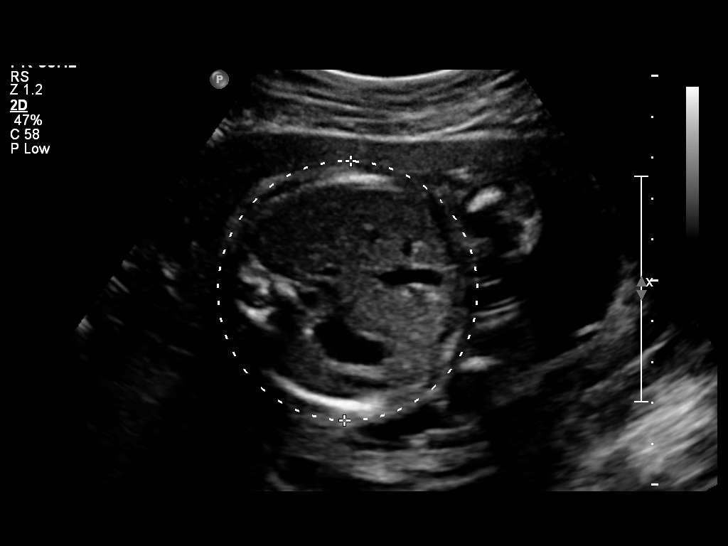
[im 21/37]
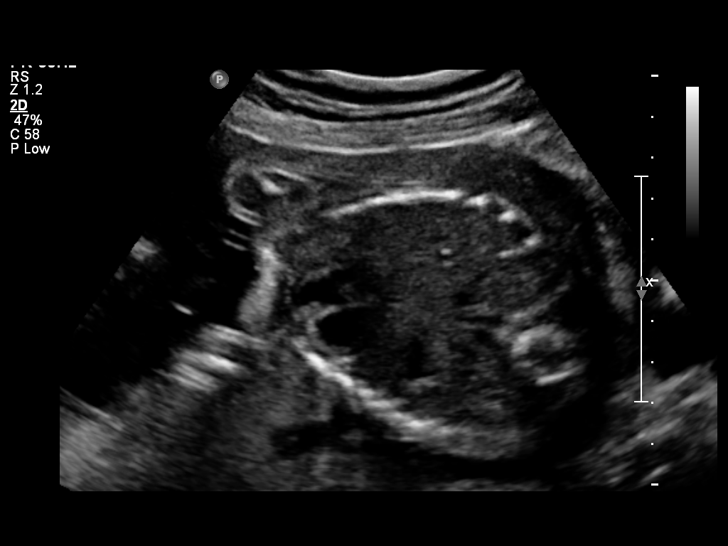
[im 24/37]
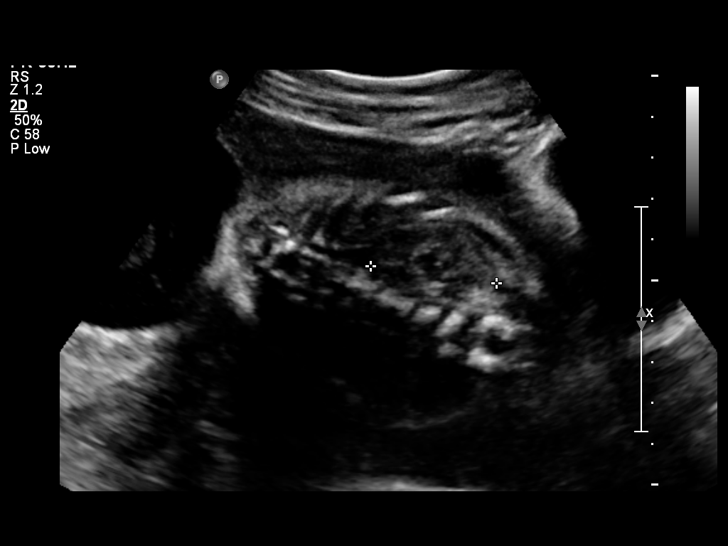
[im 27/37]
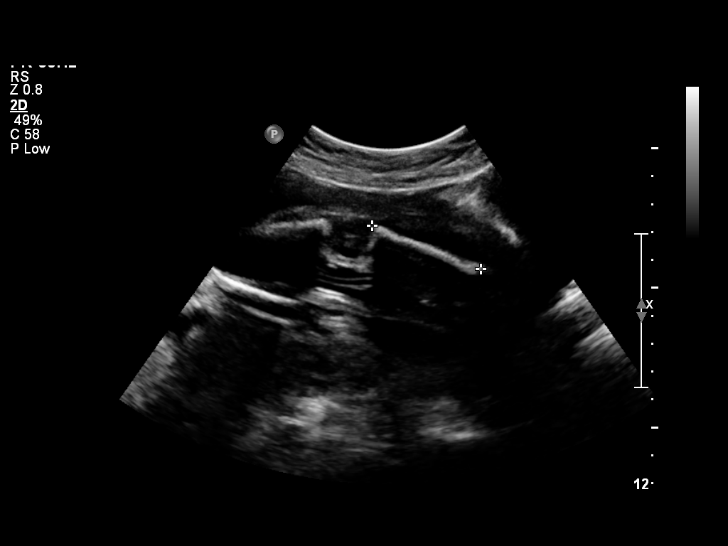
[im 30/37]
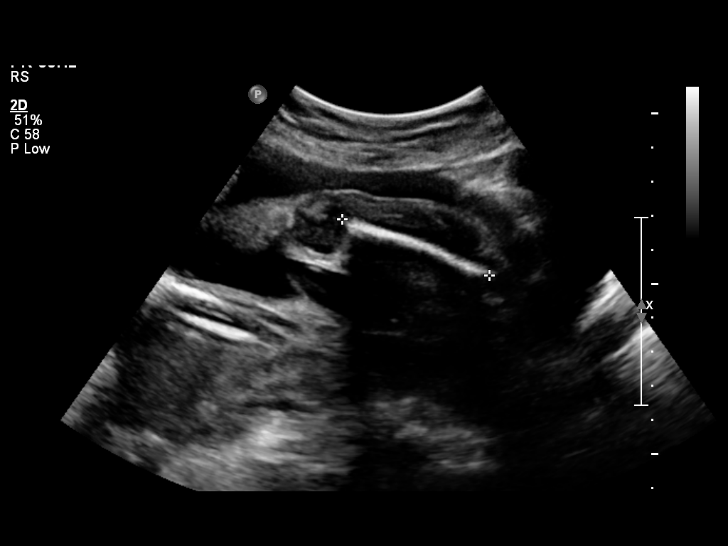
[im 32/37]
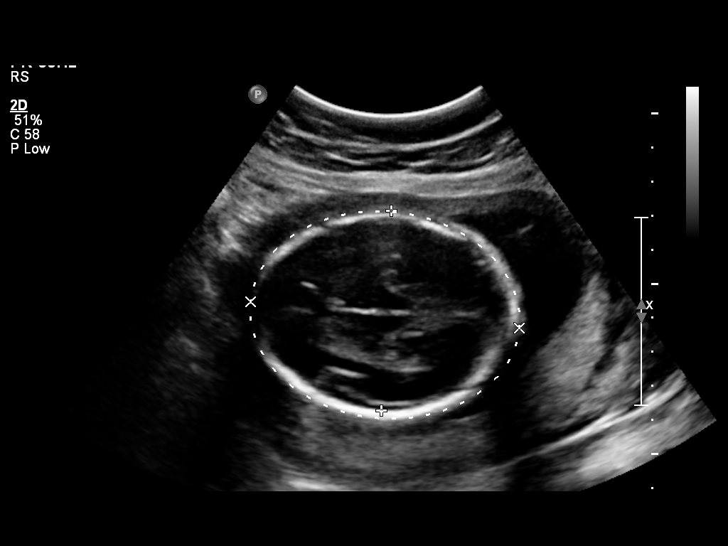
[im 35/37]
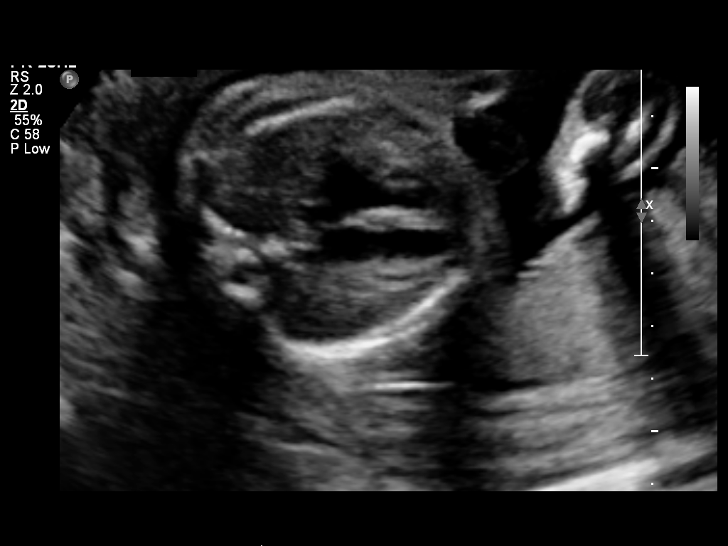

[13 of 28 positions shown; findings below may reference images not displayed]

OBSTETRICS REPORT
(Signed Final 07/13/2014 [DATE])

Service(s) Provided

US OB FOLLOW UP                                       76816.1
Indications

24 weeks gestation of pregnancy
Maternal care for known of suspected poor fetal
growth, second trimester, not applicable or
unspecified
Fetal Evaluation

Num Of Fetuses:    1
Fetal Heart Rate:  144                          bpm
Cardiac Activity:  Observed
Presentation:      Cephalic
Placenta:          Posterior, above cervical
os
P. Cord            Previously Visualized
Insertion:

Amniotic Fluid
AFI FV:      Subjectively within normal limits
Larg Pckt:     4.1  cm
Biometry

BPD:     58.4  mm     G. Age:  23w 6d                CI:         73.7   70 - 86
FL/HC:      20.3   18.7 -
20.9
HC:     216.1  mm     G. Age:  23w 4d       10  %    HC/AC:      1.08   1.05 -
1.21
AC:       201  mm     G. Age:  24w 5d       51  %    FL/BPD:     75.0   71 - 87
FL:      43.8  mm     G. Age:  24w 3d       36  %    FL/AC:      21.8   20 - 24

Est. FW:     698  gm      1 lb 9 oz     53  %
Gestational Age

U/S Today:     24w 1d                                        EDD:   11/01/14
Best:          24w 3d     Det. By:  U/S    (06/08/14)        EDD:   10/30/14
Anatomy

Cranium:          Appears normal         Aortic Arch:      Previously seen
Fetal Cavum:      Appears normal         Ductal Arch:      Previously seen
Ventricles:       Appears normal         Diaphragm:        Appears normal
Choroid Plexus:   Previously seen        Stomach:          Appears normal, left
sided
Cerebellum:       Previously seen        Abdomen:          Appears normal
Posterior Fossa:  Previously seen        Abdominal Wall:   Previously seen
Nuchal Fold:      Not applicable (>20    Cord Vessels:     Previously seen
wks GA)
Face:             Orbits and profile     Kidneys:          Appear normal
previously seen
Lips:             Previously seen        Bladder:          Appears normal
Heart:            Appears normal         Spine:            Previously seen
(4CH, axis, and
situs)
RVOT:             Previously seen        Lower             Previously seen
Extremities:
LVOT:             Appears normal         Upper             Previously seen
Extremities:

Other:  Heels and 5th digit previously visualized. Fetus appears to be a male.
Technically difficult due to fetal position.
Cervix Uterus Adnexa

Cervical Length:    3.2      cm

Cervix:       Normal appearance by transabdominal scan.
Impression

SIUP at 24+3 weeks
Normal interval anatomy; anatomic survey complete
Normal amniotic fluid volume
Appropriate interval growth with EFW at the 53rd %tile (5
weeks of growth over past 4 weeks)

EDC changed to 10/30/14 (first US)
Recommendations

Follow-up as clinically indicated

questions or concerns.

## 2017-04-19 ENCOUNTER — Encounter: Payer: Self-pay | Admitting: *Deleted

## 2018-11-23 ENCOUNTER — Encounter: Payer: Self-pay | Admitting: Obstetrics and Gynecology

## 2018-11-23 ENCOUNTER — Ambulatory Visit (INDEPENDENT_AMBULATORY_CARE_PROVIDER_SITE_OTHER): Payer: Medicaid Other | Admitting: Obstetrics and Gynecology

## 2018-11-23 ENCOUNTER — Other Ambulatory Visit: Payer: Self-pay

## 2018-11-23 ENCOUNTER — Other Ambulatory Visit (HOSPITAL_COMMUNITY)
Admission: RE | Admit: 2018-11-23 | Discharge: 2018-11-23 | Disposition: A | Discharge status: Home / Self Care | Payer: Medicaid Other | Source: Ambulatory Visit | Attending: Obstetrics and Gynecology | Admitting: Obstetrics and Gynecology

## 2018-11-23 VITALS — BP 134/81 | HR 89 | Wt 153.6 lb

## 2018-11-23 DIAGNOSIS — N898 Other specified noninflammatory disorders of vagina: Secondary | ICD-10-CM

## 2018-11-23 DIAGNOSIS — N939 Abnormal uterine and vaginal bleeding, unspecified: Secondary | ICD-10-CM

## 2018-11-23 NOTE — Progress Notes (Signed)
Obstetrics and Gynecology New Patient Evaluation  Appointment Date: 11/23/2018  OBGYN Clinic: [facility]  Primary Care Provider: Patient, No Pcp Per  Referring Provider: Self  Chief Complaint: AUB  History of Present Illness: Deanna Thornton is a 28 y.o. Asian [id number] (LMP: unknown) seen for the above chief complaint. Her past medical history is significant for nothing.  Ever since birth of last child (vag delivery) 3 years ago she's had AUB. She breast fed for 6 months afterwards and never did birth control after that. She states that she has bleeding every month for multiple days and sometimes into weeks. Not particularly painful and she has no PMS s/s. No complications at delivery per note.   She's also had vag discharge (yellow/white, sometimes smell) since that time as well.   No abdominal pain, dysuria, hematuria, vaginal itching, dyspareunia No h/o migraines HAs, VTEs  Review of Systems:a s noted in the History of Present Illness.  Past Medical History:  Past Medical History:  Diagnosis Date  . Medical history non-contributory     Past Surgical History:  Past Surgical History:  Procedure Laterality Date  . NO PAST SURGERIES      Past Obstetrical History:  OB History  Gravida Para Term Preterm AB Living  2 2 2     2   SAB TAB Ectopic Multiple Live Births        0 2    # Outcome Date GA Lbr Len/2nd Weight Sex Delivery Anes PTL Lv  2 Term 10/29/14 [redacted]w[redacted]d 07:30 / 00:12 7 lb 8 oz (3.402 kg) M Vag-Spont None  LIV  1 Term 04/02/13 [redacted]w[redacted]d 15:31 / 00:18 7 lb 4.2 oz (3.294 kg) M Vag-Spont None  LIV    Past Gynecological History: As per HPI. History of Pap Smear(s): Yes.   Last pap 2016, which was negative  Social History:  Social History   Socioeconomic History  . Marital status: Single    Spouse name: Not on file  . Number of children: Not on file  . Years of education: Not on file  . Highest education level: Not on file  Occupational  History  . Not on file  Social Needs  . Financial resource strain: Not on file  . Food insecurity    Worry: Not on file    Inability: Not on file  . Transportation needs    Medical: Not on file    Non-medical: Not on file  Tobacco Use  . Smoking status: Never Smoker  . Smokeless tobacco: Never Used  Substance and Sexual Activity  . Alcohol use: No  . Drug use: No  . Sexual activity: Yes    Birth control/protection: None  Lifestyle  . Physical activity    Days per week: Not on file    Minutes per session: Not on file  . Stress: Not on file  Relationships  . Social 2017 on phone: Not on file    Gets together: Not on file    Attends religious service: Not on file    Active member of club or organization: Not on file    Attends meetings of clubs or organizations: Not on file    Relationship status: Not on file  . Intimate partner violence    Fear of current or ex partner: Not on file    Emotionally abused: Not on file    Physically abused: Not on file    Forced sexual activity: Not on file  Other  Topics Concern  . Not on file  Social History Narrative  . Not on file    Family History:  Family History  Problem Relation Age of Onset  . Hypertension Mother   . Hyperthyroidism Mother   . Diabetes Mother     Medications Deanna Thornton had no medications administered during this visit. No current outpatient medications on file.   No current facility-administered medications for this visit.     Allergies Patient has no known allergies.   Physical Exam:  BP 134/81   Pulse 89   Wt 153 lb 9.6 oz (69.7 kg)   LMP 10/19/2018 (Exact Date)   BMI 27.65 kg/m  Body mass index is 27.65 kg/m.  General appearance: Well nourished, well developed female in no acute distress.  Cardiovascular: normal s1 and s2.  No murmurs, rubs or gallops. Respiratory:  Clear to auscultation bilateral. Normal respiratory effort Abdomen: positive bowel sounds and no masses,  hernias; diffusely non tender to palpation, non distended Neuro/Psych:  Normal mood and affect.  Skin:  Warm and dry.  Lymphatic:  No inguinal lymphadenopathy.   Pelvic exam: is not limited by body habitus EGBUS: within normal limits, Vagina: within normal limits and with no blood or discharge in the vault, Cervix: normal appearing cervix without tenderness, discharge or lesions. Uterus:  Mobile, enlarged, c/w 10 week size and non tender and Adnexa:  normal adnexa and no mass, fullness, tenderness Rectovaginal: deferred  Laboratory: none  Radiology: none  Assessment: pt stable  Plan: 1. Abnormal uterine bleeding (AUB) Standard lab and u/s work up and then follow up to go over results. Pt not interested in anything for [facility], at the current time.  - TSH - Prolactin - CBC - Cytology - PAP( [place]) - Beta hCG quant (ref lab) - US PELVIC COMPLETE WITH TRANSVAGINAL; Future  2. Vaginal discharge No gel used on speculum for sample - Cervicovaginal ancillary only  RTC after u/s  Durene Romans MD Attending Center for [facility])

## 2018-11-24 LAB — CBC
Hematocrit: 42.5 % (ref 34.0–46.6)
Hemoglobin: 14.5 g/dL (ref 11.1–15.9)
MCH: 29.8 pg (ref 26.6–33.0)
MCHC: 34.1 g/dL (ref 31.5–35.7)
MCV: 87 fL (ref 79–97)
Platelets: 257 10*3/uL (ref 150–450)
RBC: 4.87 x10E6/uL (ref 3.77–5.28)
RDW: 12.1 % (ref 11.7–15.4)
WBC: 10.1 10*3/uL (ref 3.4–10.8)

## 2018-11-24 LAB — PROLACTIN: Prolactin: 13 ng/mL (ref 4.8–23.3)

## 2018-11-24 LAB — BETA HCG QUANT (REF LAB): hCG Quant: <1 m[IU]/mL

## 2018-11-24 LAB — TSH: TSH: 2.96 u[IU]/mL (ref 0.450–4.500)

## 2018-12-01 ENCOUNTER — Ambulatory Visit (HOSPITAL_COMMUNITY)
Admission: RE | Admit: 2018-12-01 | Discharge: 2018-12-01 | Disposition: A | Discharge status: Home / Self Care | Payer: Medicaid Other | Source: Ambulatory Visit | Attending: Obstetrics and Gynecology | Admitting: Obstetrics and Gynecology

## 2018-12-01 ENCOUNTER — Other Ambulatory Visit: Payer: Self-pay

## 2018-12-01 DIAGNOSIS — N939 Abnormal uterine and vaginal bleeding, unspecified: Secondary | ICD-10-CM | POA: Diagnosis not present

## 2018-12-05 LAB — CYTOLOGY - PAP
Chlamydia: NEGATIVE
Comment: NEGATIVE
Comment: NEGATIVE
Comment: NORMAL
Diagnosis: NEGATIVE
Neisseria Gonorrhea: NEGATIVE
Trichomonas: NEGATIVE

## 2018-12-06 LAB — CERVICOVAGINAL ANCILLARY ONLY
Bacterial Vaginitis (gardnerella): NEGATIVE
Candida Glabrata: NEGATIVE
Candida Vaginitis: NEGATIVE
Comment: NEGATIVE
Comment: NEGATIVE
Comment: NEGATIVE

## 2018-12-08 ENCOUNTER — Telehealth: Payer: Self-pay | Admitting: Obstetrics and Gynecology

## 2018-12-08 NOTE — Telephone Encounter (Signed)
GYN Telephone Note  Patient called at [phone] to go over ultrasound findings. Generic VM answered and VM left asking pt call the office.   Durene Romans MD Attending [facility] for [facility] (Faculty Practice) 12/08/2018 Time: 1221pm

## 2018-12-08 NOTE — Telephone Encounter (Signed)
GYN Telephone Note Patient called the office back and d/w her re: neg lab results but possible uterine cavity findings. I told her I recommend a hysteroscopy, d&c given possible anatomy issue causing her aub, and I don't think hormone tx would help. Pt to consider options and let us know.  Durene Romans MD Attending [facility] (Faculty Practice) 12/08/2018 Time: [phone]

## 2019-01-03 ENCOUNTER — Other Ambulatory Visit: Payer: Self-pay

## 2019-01-03 ENCOUNTER — Encounter (HOSPITAL_BASED_OUTPATIENT_CLINIC_OR_DEPARTMENT_OTHER): Payer: Self-pay | Admitting: *Deleted

## 2019-01-05 ENCOUNTER — Telehealth: Payer: Self-pay | Admitting: Obstetrics and Gynecology

## 2019-01-05 NOTE — Telephone Encounter (Signed)
°  Attempted to call patient about her appointment on 11/20 @ 9:35. No answer left voicemail instructing patient to wear a face mask for the entire appointment and no visitors are allowed during the visit. Patient instructed not to attend the appointment if she was any symptoms. Symptom list and office number left.  °

## 2019-01-06 ENCOUNTER — Other Ambulatory Visit: Payer: Self-pay | Admitting: Obstetrics and Gynecology

## 2019-01-06 ENCOUNTER — Ambulatory Visit: Payer: Medicaid Other | Admitting: Obstetrics and Gynecology

## 2019-01-06 NOTE — Progress Notes (Signed)
GYN Note Patient called and an no questions or concerns for today or surgery for 11/25. Okay to cancel visit for this morning  Durene Romans MD Attending [facility] (Faculty Practice) 01/06/2019 Time: [phone]

## 2019-01-07 ENCOUNTER — Other Ambulatory Visit (HOSPITAL_COMMUNITY)
Admission: RE | Admit: 2019-01-07 | Discharge: 2019-01-07 | Disposition: A | Discharge status: Home / Self Care | Payer: Medicaid Other | Source: Ambulatory Visit | Attending: Obstetrics and Gynecology | Admitting: Obstetrics and Gynecology

## 2019-01-07 DIAGNOSIS — Z20828 Contact with and (suspected) exposure to other viral communicable diseases: Secondary | ICD-10-CM | POA: Insufficient documentation

## 2019-01-07 DIAGNOSIS — Z01812 Encounter for preprocedural laboratory examination: Secondary | ICD-10-CM | POA: Insufficient documentation

## 2019-01-09 LAB — NOVEL CORONAVIRUS, NAA (HOSP ORDER, SEND-OUT TO REF LAB; TAT 18-24 HRS): SARS-CoV-2, NAA: NOT DETECTED

## 2019-01-10 NOTE — Progress Notes (Signed)
Left message to come pick up Ensure pre surgery drink and POCT.

## 2019-01-10 NOTE — Anesthesia Preprocedure Evaluation (Addendum)
Anesthesia Evaluation  Patient identified by MRN, date of birth, ID band Patient awake    Reviewed: Allergy & Precautions, NPO status , Patient's Chart, lab work & pertinent test results  Airway Mallampati: II  TM Distance: >3 FB Neck ROM: Full    Dental  (+) Dental Advisory Given, Teeth Intact   Pulmonary neg pulmonary ROS,    Pulmonary exam normal breath sounds clear to auscultation       Cardiovascular negative cardio ROS Normal cardiovascular exam Rhythm:Regular Rate:Normal     Neuro/Psych negative neurological ROS  negative psych ROS   GI/Hepatic negative GI ROS, Neg liver ROS,   Endo/Other  negative endocrine ROS  Renal/GU negative Renal ROS     Musculoskeletal negative musculoskeletal ROS (+)   Abdominal   Peds  Hematology negative hematology ROS (+)   Anesthesia Other Findings   Reproductive/Obstetrics negative OB ROS                             Anesthesia Physical Anesthesia Plan  ASA: II  Anesthesia Plan: General   Post-op Pain Management:    Induction: Intravenous  PONV Risk Score and Plan: 4 or greater and Ondansetron, Dexamethasone, Midazolam, Treatment may vary due to age or medical condition and Scopolamine patch - Pre-op  Airway Management Planned: LMA  Additional Equipment: None  Intra-op Plan:   Post-operative Plan: Extubation in OR  Informed Consent: I have reviewed the patients History and Physical, chart, labs and discussed the procedure including the risks, benefits and alternatives for the proposed anesthesia with the patient or authorized representative who has indicated his/her understanding and acceptance.     Dental advisory given  Plan Discussed with: CRNA  Anesthesia Plan Comments:         Anesthesia Quick Evaluation

## 2019-01-11 ENCOUNTER — Encounter (HOSPITAL_BASED_OUTPATIENT_CLINIC_OR_DEPARTMENT_OTHER): Payer: Self-pay | Admitting: *Deleted

## 2019-01-11 ENCOUNTER — Ambulatory Visit (HOSPITAL_BASED_OUTPATIENT_CLINIC_OR_DEPARTMENT_OTHER): Payer: Medicaid Other | Admitting: Anesthesiology

## 2019-01-11 ENCOUNTER — Other Ambulatory Visit: Payer: Self-pay

## 2019-01-11 ENCOUNTER — Ambulatory Visit (HOSPITAL_BASED_OUTPATIENT_CLINIC_OR_DEPARTMENT_OTHER)
Admission: RE | Admit: 2019-01-11 | Discharge: 2019-01-11 | Disposition: A | Discharge status: Home / Self Care | Payer: Medicaid Other | Attending: Obstetrics and Gynecology | Admitting: Obstetrics and Gynecology

## 2019-01-11 ENCOUNTER — Encounter (HOSPITAL_BASED_OUTPATIENT_CLINIC_OR_DEPARTMENT_OTHER)
Admission: RE | Disposition: A | Discharge status: Home / Self Care | Payer: Self-pay | Source: Home / Self Care | Attending: Obstetrics and Gynecology

## 2019-01-11 DIAGNOSIS — N840 Polyp of corpus uteri: Secondary | ICD-10-CM | POA: Diagnosis not present

## 2019-01-11 DIAGNOSIS — Z9889 Other specified postprocedural states: Secondary | ICD-10-CM

## 2019-01-11 DIAGNOSIS — N939 Abnormal uterine and vaginal bleeding, unspecified: Secondary | ICD-10-CM | POA: Insufficient documentation

## 2019-01-11 HISTORY — PX: HYSTEROSCOPY WITH D & C: SHX1775

## 2019-01-11 HISTORY — DX: Abnormal uterine and vaginal bleeding, unspecified: N93.9

## 2019-01-11 LAB — POCT PREGNANCY, URINE: Preg Test, Ur: NEGATIVE

## 2019-01-11 SURGERY — DILATATION AND CURETTAGE /HYSTEROSCOPY
Anesthesia: General | Site: Vagina

## 2019-01-11 MED ORDER — IBUPROFEN 600 MG PO TABS: 600.0000 mg | ORAL_TABLET | Freq: Four times a day (QID) | ORAL | 0 refills | Status: DC | PRN

## 2019-01-11 MED ORDER — LACTATED RINGERS IV SOLN: INTRAVENOUS | Status: DC

## 2019-01-11 MED ORDER — OXYCODONE-ACETAMINOPHEN 5-325 MG PO TABS: 1.0000 | ORAL_TABLET | Freq: Four times a day (QID) | ORAL | 0 refills | Status: DC | PRN

## 2019-01-11 MED ORDER — MIDAZOLAM HCL 2 MG/2ML IJ SOLN
INTRAMUSCULAR | Status: DC | PRN
  Administered 2019-01-11: 2 mg via INTRAVENOUS

## 2019-01-11 MED ORDER — LACTATED RINGERS IV SOLN
INTRAVENOUS | Status: DC
  Administered 2019-01-11: 08:00:00 via INTRAVENOUS

## 2019-01-11 MED ORDER — LIDOCAINE HCL 1 % IJ SOLN
INTRAMUSCULAR | Status: DC | PRN
  Administered 2019-01-11: 20 mL

## 2019-01-11 MED ORDER — MEPERIDINE HCL 25 MG/ML IJ SOLN: 6.2500 mg | INTRAMUSCULAR | Status: DC | PRN

## 2019-01-11 MED ORDER — SCOPOLAMINE 1 MG/3DAYS TD PT72
MEDICATED_PATCH | TRANSDERMAL | Status: AC
  Filled 2019-01-11: qty 1

## 2019-01-11 MED ORDER — ONDANSETRON HCL 4 MG/2ML IJ SOLN
INTRAMUSCULAR | Status: AC
  Filled 2019-01-11: qty 2

## 2019-01-11 MED ORDER — METHYLERGONOVINE MALEATE 0.2 MG/ML IJ SOLN
INTRAMUSCULAR | Status: AC
  Filled 2019-01-11: qty 1

## 2019-01-11 MED ORDER — PROMETHAZINE HCL 25 MG/ML IJ SOLN: 6.2500 mg | INTRAMUSCULAR | Status: DC | PRN

## 2019-01-11 MED ORDER — MIDAZOLAM HCL 2 MG/2ML IJ SOLN
INTRAMUSCULAR | Status: AC
  Filled 2019-01-11: qty 2

## 2019-01-11 MED ORDER — OXYCODONE HCL 5 MG/5ML PO SOLN: 5.0000 mg | Freq: Once | ORAL | Status: DC | PRN

## 2019-01-11 MED ORDER — DEXAMETHASONE SODIUM PHOSPHATE 10 MG/ML IJ SOLN
INTRAMUSCULAR | Status: DC | PRN
  Administered 2019-01-11: 5 mg via INTRAVENOUS

## 2019-01-11 MED ORDER — DEXAMETHASONE SODIUM PHOSPHATE 10 MG/ML IJ SOLN
INTRAMUSCULAR | Status: AC
  Filled 2019-01-11: qty 1

## 2019-01-11 MED ORDER — PROPOFOL 10 MG/ML IV BOLUS
INTRAVENOUS | Status: DC | PRN
  Administered 2019-01-11: 160 mg via INTRAVENOUS

## 2019-01-11 MED ORDER — LIDOCAINE HCL (PF) 1 % IJ SOLN
INTRAMUSCULAR | Status: AC
  Filled 2019-01-11: qty 30

## 2019-01-11 MED ORDER — ONDANSETRON HCL 4 MG/2ML IJ SOLN
INTRAMUSCULAR | Status: DC | PRN
  Administered 2019-01-11: 4 mg via INTRAVENOUS

## 2019-01-11 MED ORDER — FENTANYL CITRATE (PF) 100 MCG/2ML IJ SOLN
INTRAMUSCULAR | Status: DC | PRN
  Administered 2019-01-11 (×2): 50 ug via INTRAVENOUS

## 2019-01-11 MED ORDER — KETOROLAC TROMETHAMINE 30 MG/ML IJ SOLN
INTRAMUSCULAR | Status: AC
  Filled 2019-01-11: qty 1

## 2019-01-11 MED ORDER — LIDOCAINE 2% (20 MG/ML) 5 ML SYRINGE
INTRAMUSCULAR | Status: AC
  Filled 2019-01-11: qty 5

## 2019-01-11 MED ORDER — ACETAMINOPHEN 500 MG PO TABS: 500.0000 mg | ORAL_TABLET | Freq: Four times a day (QID) | ORAL | 0 refills | Status: DC | PRN

## 2019-01-11 MED ORDER — HYDROMORPHONE HCL 1 MG/ML IJ SOLN: 0.2500 mg | INTRAMUSCULAR | Status: DC | PRN

## 2019-01-11 MED ORDER — SODIUM CHLORIDE 0.9 % IV SOLN
INTRAVENOUS | Status: AC
  Filled 2019-01-11: qty 100

## 2019-01-11 MED ORDER — KETOROLAC TROMETHAMINE 30 MG/ML IJ SOLN
INTRAMUSCULAR | Status: DC | PRN
  Administered 2019-01-11: 30 mg via INTRAVENOUS

## 2019-01-11 MED ORDER — OXYCODONE HCL 5 MG PO TABS: 5.0000 mg | ORAL_TABLET | Freq: Once | ORAL | Status: DC | PRN

## 2019-01-11 MED ORDER — SODIUM CHLORIDE 0.9 % IV SOLN
100.0000 mg | Freq: Once | INTRAVENOUS | Status: AC
  Administered 2019-01-11: 100 mg via INTRAVENOUS

## 2019-01-11 MED ORDER — PROPOFOL 10 MG/ML IV BOLUS
INTRAVENOUS | Status: AC
  Filled 2019-01-11: qty 20

## 2019-01-11 MED ORDER — FENTANYL CITRATE (PF) 100 MCG/2ML IJ SOLN
INTRAMUSCULAR | Status: AC
  Filled 2019-01-11: qty 2

## 2019-01-11 MED ORDER — LIDOCAINE HCL (CARDIAC) PF 100 MG/5ML IV SOSY
PREFILLED_SYRINGE | INTRAVENOUS | Status: DC | PRN
  Administered 2019-01-11: 100 mg via INTRAVENOUS

## 2019-01-11 MED ORDER — SCOPOLAMINE 1 MG/3DAYS TD PT72
MEDICATED_PATCH | TRANSDERMAL | Status: DC | PRN
  Administered 2019-01-11: 1 via TRANSDERMAL

## 2019-01-11 MED ORDER — SODIUM CHLORIDE 0.9 % IR SOLN
Status: DC | PRN
  Administered 2019-01-11: 3000 mL

## 2019-01-11 MED ORDER — SILVER NITRATE-POT NITRATE 75-25 % EX MISC
CUTANEOUS | Status: AC
  Filled 2019-01-11: qty 10

## 2019-01-11 SURGICAL SUPPLY — 19 items
BRIEF STRETCH FOR OB PAD XXL (UNDERPADS AND DIAPERS) ×3 IMPLANT
CANISTER SUCT 3000ML PPV (MISCELLANEOUS) ×3 IMPLANT
CATH ROBINSON RED A/P 16FR (CATHETERS) ×3 IMPLANT
DEVICE MYOSURE LITE (MISCELLANEOUS) ×3 IMPLANT
GAUZE 4X4 16PLY RFD (DISPOSABLE) ×3 IMPLANT
GLOVE BIOGEL PI IND STRL 7.0 (GLOVE) ×1 IMPLANT
GLOVE BIOGEL PI IND STRL 7.5 (GLOVE) ×1 IMPLANT
GLOVE BIOGEL PI INDICATOR 7.0 (GLOVE) ×2
GLOVE BIOGEL PI INDICATOR 7.5 (GLOVE) ×2
GLOVE SURG SS PI 7.0 STRL IVOR (GLOVE) ×3 IMPLANT
GOWN STRL REUS W/TWL LRG LVL3 (GOWN DISPOSABLE) ×3 IMPLANT
GOWN STRL REUS W/TWL XL LVL3 (GOWN DISPOSABLE) ×3 IMPLANT
KIT PROCEDURE FLUENT (KITS) ×3 IMPLANT
PACK VAGINAL MINOR WOMEN LF (CUSTOM PROCEDURE TRAY) ×3 IMPLANT
PAD OB MATERNITY 4.3X12.25 (PERSONAL CARE ITEMS) ×3 IMPLANT
PAD PREP 24X48 CUFFED NSTRL (MISCELLANEOUS) ×3 IMPLANT
SEAL ROD LENS SCOPE MYOSURE (ABLATOR) ×3 IMPLANT
SLEEVE SCD COMPRESS KNEE MED (MISCELLANEOUS) ×6 IMPLANT
TOWEL GREEN STERILE FF (TOWEL DISPOSABLE) ×6 IMPLANT

## 2019-01-11 NOTE — Transfer of Care (Signed)
Immediate Anesthesia Transfer of Care Note  Patient: Deanna Thornton  Procedure(s) Performed: DILATATION AND CURETTAGE /HYSTEROSCOPY WITH MYOSURE POLYPECTOMY (N/A Vagina )  Patient Location: PACU  Anesthesia Type:General  Level of Consciousness: awake, alert , oriented and patient cooperative  Airway & Oxygen Therapy: Patient Spontanous Breathing and Patient connected to nasal cannula oxygen  Post-op Assessment: Report given to RN and Post -op Vital signs reviewed and stable  Post vital signs: Reviewed and stable  Last Vitals:  Vitals Value Taken Time  BP 119/70 01/11/19 0945  Temp    Pulse 94 01/11/19 0946  Resp 15 01/11/19 0946  SpO2 100 % 01/11/19 0946  Vitals shown include unvalidated device data.  Last Pain:  Vitals:   01/11/19 0743  TempSrc: Temporal  PainSc: 0-No pain         Complications: No apparent anesthesia complications

## 2019-01-11 NOTE — Anesthesia Postprocedure Evaluation (Signed)
Anesthesia Post Note  Patient: Deanna Thornton  Procedure(s) Performed: DILATATION AND CURETTAGE /HYSTEROSCOPY WITH MYOSURE POLYPECTOMY (N/A Vagina )     Patient location during evaluation: PACU Anesthesia Type: General Level of consciousness: awake and alert and oriented Pain management: pain level controlled Vital Signs Assessment: post-procedure vital signs reviewed and stable Respiratory status: spontaneous breathing, nonlabored ventilation and respiratory function stable Cardiovascular status: blood pressure returned to baseline and stable Postop Assessment: no apparent nausea or vomiting Anesthetic complications: no    Last Vitals:  Vitals:   01/11/19 0945 01/11/19 1000  BP: 119/70 123/77  Pulse: 94 80  Resp: 16 19  Temp: 36.7 C   SpO2: 100% 100%    Last Pain:  Vitals:   01/11/19 0945  TempSrc:   PainSc: 0-No pain                 Noland Pizano A.

## 2019-01-11 NOTE — Anesthesia Procedure Notes (Signed)
Procedure Name: LMA Insertion Date/Time: 01/11/2019 9:01 AM Performed by: Raenette Rover, CRNA Pre-anesthesia Checklist: Patient identified, Emergency Drugs available, Suction available and Patient being monitored Patient Re-evaluated:Patient Re-evaluated prior to induction Oxygen Delivery Method: Circle system utilized Preoxygenation: Pre-oxygenation with 100% oxygen Induction Type: IV induction LMA: LMA inserted LMA Size: 4.0 Number of attempts: 1 Placement Confirmation: positive ETCO2 and breath sounds checked- equal and bilateral Tube secured with: Tape Dental Injury: Teeth and Oropharynx as per pre-operative assessment

## 2019-01-11 NOTE — Op Note (Addendum)
Operative Note   01/11/2019  PRE-OP DIAGNOSIS: Abnormal uterine bleeding. Abnormal endometrial lining on ultrasound   POST-OP DIAGNOSIS: Same. Large endometrial polyp   SURGEON: Surgeon(s) and Role:    * Nabiha Planck, Eduard Clos, MD - Primary  ASSISTANT: None  PROCEDURE: Procedure(s): DILATATION AND CURETTAGE /HYSTEROSCOPY WITH MYOSURE POLYPECTOMY   ANESTHESIA: General and paracervical block  ESTIMATED BLOOD LOSS: 36mL  DRAINS: I/O cath for 263mL UOP   TOTAL IV FLUIDS: per anesthesia note  SPECIMENS: endometrial polyp and curettings  VTE PROPHYLAXIS: SCDs to the bilateral lower extremities  ANTIBIOTICS: Doxycycline 100mg  IV x 1  FLUID DEFICIT: 32DJ  COMPLICATIONS: None  DISPOSITION: PACU - hemodynamically stable.  CONDITION: stable  FINDINGS: Exam under anesthesia revealed small, mobile 8-10wk sized, mobile uterus with no masses and bilateral adnexa without masses or fullness. Patient sounded to 10cm; of note, opening into uterine cavity is off to patient's right (go in at the right of the external os).  Hysteroscopy revealed large endometrial polyp in the midline starting at the fundus down into just past the internal os. Otherwise, normal appearing uterine cavity (doesn't appear "fluffy" or thick) and normal ostia bilaterally. Normal endocervical canal  PROCEDURE IN DETAIL:  After informed consent was obtained, the patient was taken to the operating room where anesthesia was obtained without difficulty. The patient was positioned in the dorsal lithotomy position in [place].  The patient's bladder was catheterized with an in and out foley catheter.  The patient was examined under anesthesia, with the above noted findings.  The bi-valved speculum was placed inside the patient's vagina, and the the anterior lip of the cervix was seen and grasped with the tenaculum.  A paracervical block was achieved with 32mL 1% lidocaine.  The uterine cavity was sounded to 10cm, and then the  cervix was progressively dilated to a 17 French-Pratt dilator.  The hysteroscope was introduced, with the above noted findings. The diagnostic scope was switched out for the Myosure scope and the canal dilated to 21.  The polypectomy was easily done with the Myosure. . The hystersocope was removed and the uterine cavity was gently curetted until a gritty texture was noted, yielding scant endometrial curettings.  Excellent hemostasis was noted, and all instruments were removed, with excellent hemostasis noted throughout.  She was then taken out of dorsal lithotomy. The patient tolerated the procedure well.  Sponge, lap and instrument counts were correct x2.  The patient was taken to recovery room in excellent condition.  Durene Romans MD Attending Center for [facility] [employer])

## 2019-01-11 NOTE — Discharge Instructions (Addendum)
Hysteroscopy, Care After This sheet gives you information about how to care for yourself after your procedure. Your health care provider may also give you more specific instructions. If you have problems or questions, contact your health care provider. What can I expect after the procedure? After the procedure, it is common to have:  Cramping.  Bleeding. This can vary from light spotting to menstrual-like bleeding. Follow these instructions at home: Activity  Rest for 1-2 days after the procedure.  Do not douche, use tampons, or have sex for 2 weeks after the procedure, or until your health care provider approves.  Do not drive for 24 hours after the procedure, or for as long as told by your health care provider.  Do not drive, use heavy machinery, or drink alcohol while taking prescription pain medicines. Medicines   Take over-the-counter and prescription medicines only as told by your health care provider.  Do not take aspirin during recovery. It can increase the risk of bleeding. General instructions  Do not take baths, swim, or use a hot tub until your health care provider approves. Take showers instead of baths for 2 weeks, or for as long as told by your health care provider.  To prevent or treat constipation while you are taking prescription pain medicine, your health care provider may recommend that you: ? Drink enough fluid to keep your urine clear or pale yellow. ? Take over-the-counter or prescription medicines. ? Eat foods that are high in fiber, such as fresh fruits and vegetables, whole grains, and beans. ? Limit foods that are high in fat and processed sugars, such as fried and sweet foods.  Keep all follow-up visits as told by your health care provider. This is important. Contact a health care provider if:  You feel dizzy or lightheaded.  You feel nauseous.  You have abnormal vaginal discharge.  You have a rash.  You have pain that does not get better with  medicine.  You have chills. Get help right away if:  You have bleeding that is heavier than a normal menstrual period.  You have a fever.  You have pain or cramps that get worse.  You develop new abdominal pain.  You faint.  You have pain in your shoulders.  You have shortness of breath. Summary  After the procedure, you may have cramping and some vaginal bleeding.  Do not douche, use tampons, or have sex until your health care provider approves.  Do not take baths, swim, or use a hot tub until your health care provider approves. Take showers instead of baths  for as long as told by your health care provider.  Report any unusual symptoms to your health care provider.  Keep all follow-up visits as told by your health care provider. This is important. This information is not intended to replace advice given to you by your health care provider. Make sure you discuss any questions you have with your health care provider. Document Released: 11/23/2012 Document Revised: 01/15/2017 Document Reviewed: 03/03/2016 Elsevier Patient Education  [place].   No ibuprofen until 3:05pm   Post Anesthesia Home Care Instructions  Activity: Get plenty of rest for the remainder of the day. A responsible individual must stay with you for 24 hours following the procedure.  For the next 24 hours, DO NOT: -Drive a car -Paediatric nurse -Drink alcoholic beverages -Take any medication unless instructed by your physician -Make any legal decisions or sign important papers.  Meals: Start with liquid foods such  as gelatin or soup. Progress to regular foods as tolerated. Avoid greasy, spicy, heavy foods. If nausea and/or vomiting occur, drink only clear liquids until the nausea and/or vomiting subsides. Call your physician if vomiting continues.  Special Instructions/Symptoms: Your throat may feel dry or sore from the anesthesia or the breathing tube placed in your throat during  surgery. If this causes discomfort, gargle with warm salt water. The discomfort should disappear within 24 hours.  If you had a scopolamine patch placed behind your ear for the management of post- operative nausea and/or vomiting:  1. The medication in the patch is effective for 72 hours, after which it should be removed.  Wrap patch in a tissue and discard in the trash. Wash hands thoroughly with soap and water. 2. You may remove the patch earlier than 72 hours if you experience unpleasant side effects which may include dry mouth, dizziness or visual disturbances. 3. Avoid touching the patch. Wash your hands with soap and water after contact with the patch.

## 2019-01-11 NOTE — H&P (Signed)
Obstetrics & Gynecology Surgical H&P   Date of Admission: 01/11/2019   Primary OBGYN: [facility] Primary Care Provider: Patient, No Pcp Per  Reason for Admission: scheduled surgery  History of Present Illness: Deanna Thornton is a 28 y.o. [phone] (Patient's last menstrual period was 11/22/2018.), with the above CC. PMHx is significant for nothing.  Patient seen by me on 10/7 for persistent AUB since last vag delivery (see below). Exam negative and negative pap, sti swab, tsh, prl, cbc. Pt had an u/s that showed abnormal findings (see below). Recommend hysteroscopy, d&c which she was amenable to.  Currently, just finishing up her period  Ever since birth of last child (vag delivery) 3 years ago she's had AUB. She breast fed for 6 months afterwards and never did birth control after that. She states that she has bleeding every month for multiple days and sometimes into weeks. Not particularly painful and she has no PMS s/s. No complications at delivery per note.   She's also had vag discharge (yellow/white, sometimes smell) since that time as well.    ROS: A 12-point review of systems was performed and negative, except as stated in the above HPI.  OBGYN History: As per HPI. OB History  Gravida Para Term Preterm AB Living  2 2 2     2   SAB TAB Ectopic Multiple Live Births        0 2    # Outcome Date GA Lbr Len/2nd Weight Sex Delivery Anes PTL Lv  2 Term 10/29/14 [redacted]w[redacted]d 07:30 / 00:12 3402 g M Vag-Spont None  LIV  1 Term 04/02/13 [redacted]w[redacted]d 15:31 / 00:18 3294 g M Vag-Spont None  LIV     Past Medical History: Past Medical History:  Diagnosis Date  . Abnormal uterine bleeding (AUB)   . Medical history non-contributory     Past Surgical History: Past Surgical History:  Procedure Laterality Date  . MOUTH SURGERY      Family History:  Family History  Problem Relation Age of Onset  . Hypertension Mother   . Hyperthyroidism Mother   . Diabetes Mother    Social  History:  Social History   Socioeconomic History  . Marital status: Significant Other    Spouse name: Not on file  . Number of children: Not on file  . Years of education: Not on file  . Highest education level: Not on file  Occupational History  . Not on file  Social Needs  . Financial resource strain: Not on file  . Food insecurity    Worry: Not on file    Inability: Not on file  . Transportation needs    Medical: Not on file    Non-medical: Not on file  Tobacco Use  . Smoking status: Never Smoker  . Smokeless tobacco: Never Used  Substance and Sexual Activity  . Alcohol use: No  . Drug use: No  . Sexual activity: Yes    Birth control/protection: None  Lifestyle  . Physical activity    Days per week: Not on file    Minutes per session: Not on file  . Stress: Not on file  Relationships  . Social Herbalist on phone: Not on file    Gets together: Not on file    Attends religious service: Not on file    Active member of club or organization: Not on file    Attends meetings of clubs or organizations: Not on file    Relationship status:  Not on file  . Intimate partner violence    Fear of current or ex partner: Not on file    Emotionally abused: Not on file    Physically abused: Not on file    Forced sexual activity: Not on file  Other Topics Concern  . Not on file  Social History Narrative  . Not on file    Allergy: No Known Allergies  Current Outpatient Medications: No medications prior to admission.     Hospital Medications: No current facility-administered medications for this encounter.    No current outpatient medications on file.     Physical Exam: Vitals:   01/03/19 1626  Weight: 77.1 kg  Height: 5\' 3"  (1.6 m)    VS pending  Body mass index is 30.11 kg/m. General appearance: Well nourished, well developed female in no acute distress.  Cardiovascular: S1, S2 normal, no murmur, rub or gallop, regular rate and  rhythm Respiratory:  Clear to auscultation bilateral. Normal respiratory effort Abdomen: positive bowel sounds and no masses, hernias; diffusely non tender to palpation, non distended Neuro/Psych:  Normal mood and affect.  Skin:  Warm and dry.  Extremities: no clubbing, cyanosis, or edema.   From 10/7 Pelvic exam: is not limited by body habitus EGBUS: within normal limits, Vagina: within normal limits and with no blood or discharge in the vault, Cervix: normal appearing cervix without tenderness, discharge or lesions. Uterus:  Mobile, enlarged, c/w 10 week size and non tender and Adnexa:  normal adnexa and no mass, fullness, tenderness Rectovaginal: deferred   Laboratory: Negative: COVID  Imaging:  CLINICAL DATA:  28 year old female with abnormal uterine bleeding. LMP 11/23/2018.  EXAM: TRANSABDOMINAL AND TRANSVAGINAL ULTRASOUND OF PELVIS  TECHNIQUE: Both transabdominal and transvaginal ultrasound examinations of the pelvis were performed. Transabdominal technique was performed for global imaging of the pelvis including uterus, ovaries, adnexal regions, and pelvic cul-de-sac. It was necessary to proceed with endovaginal exam following the transabdominal exam to visualize the endometrium and adnexa.  COMPARISON:  None  FINDINGS: Uterus  Measurements: 10.3 x 4.7 x 5.6 cm = volume: 142 mL. Anteverted uterus is mildly enlarged. No uterine fibroids or other myometrial abnormalities.  Endometrium  Thickness: 17 mm. Thickened heterogeneous endometrium with tiny internal endometrial cystic spaces. No endometrial cavity fluid or focal endometrial mass. No abnormal vascularity demonstrated on color Doppler.  Right ovary  Measurements: 3.6 x 3.0 x 2.3 cm = volume: 12.8 mL. Right ovarian corpus luteal cyst. No suspicious right ovarian or right adnexal masses.  Left ovary  Measurements: 3.1 x 1.4 x 2.4 cm = volume: 5.2 mL. Normal appearance/no adnexal  mass.  Other findings  No abnormal free fluid.  IMPRESSION: 1. Thickened (17 mm) heterogeneous endometrium with tiny internal cystic spaces. No focal endometrial mass. If bleeding remains unresponsive to hormonal or medical therapy, focal lesion work-up with sonohysterogram should be considered. Endometrial biopsy should also be considered in pre-menopausal patients at high risk for endometrial carcinoma. (Ref: Radiological Reasoning: Algorithmic Workup of Abnormal Vaginal Bleeding with Endovaginal Sonography and Sonohysterography. AJR 200806-10-1999). 2. No uterine fibroids. 3. No suspicious ovarian or adnexal findings.   Electronically Signed   By: ; [id number] M.D.   On: 12/01/2018 17:24  Assessment: Deanna Thornton is a 28 y.o. 26 stable  Plan: Pt consented for hysteroscopy, d&c  [id number] MD Attending [facility])

## 2019-01-11 NOTE — OR Nursing (Signed)
Pt had a half a glass of water at 7am. Case delayed by anesthesia for 2 hours. Dr. Ilda Basset notified.

## 2019-01-13 LAB — SURGICAL PATHOLOGY

## 2019-01-17 ENCOUNTER — Encounter (HOSPITAL_BASED_OUTPATIENT_CLINIC_OR_DEPARTMENT_OTHER): Payer: Self-pay | Admitting: Obstetrics and Gynecology

## 2019-02-02 DIAGNOSIS — Z20828 Contact with and (suspected) exposure to other viral communicable diseases: Secondary | ICD-10-CM | POA: Diagnosis not present

## 2019-02-06 ENCOUNTER — Telehealth: Payer: Self-pay | Admitting: Obstetrics and Gynecology

## 2019-02-06 DIAGNOSIS — Z03818 Encounter for observation for suspected exposure to other biological agents ruled out: Secondary | ICD-10-CM | POA: Diagnosis not present

## 2019-02-06 NOTE — Telephone Encounter (Signed)
Deanna Thornton called to say she was exposed to someone with COVID-19, and was not going to be able to keep her scheduled appointment. Scheduled for next available appointment which is 03/06/2019. Will inform Dr. Ilda Basset to see if he wants her to do a MyChart visit.

## 2019-02-07 DIAGNOSIS — Z7189 Other specified counseling: Secondary | ICD-10-CM | POA: Diagnosis not present

## 2019-02-07 DIAGNOSIS — Z20828 Contact with and (suspected) exposure to other viral communicable diseases: Secondary | ICD-10-CM | POA: Diagnosis not present

## 2019-02-07 DIAGNOSIS — E669 Obesity, unspecified: Secondary | ICD-10-CM | POA: Diagnosis not present

## 2019-02-07 NOTE — Telephone Encounter (Signed)
Her next visit with me can be in person. Thanks!

## 2019-02-08 ENCOUNTER — Ambulatory Visit: Payer: Medicaid Other | Admitting: Obstetrics and Gynecology

## 2019-02-20 DIAGNOSIS — Z03818 Encounter for observation for suspected exposure to other biological agents ruled out: Secondary | ICD-10-CM | POA: Diagnosis not present

## 2019-03-06 ENCOUNTER — Other Ambulatory Visit: Payer: Self-pay

## 2019-03-06 ENCOUNTER — Encounter: Payer: Self-pay | Admitting: Obstetrics and Gynecology

## 2019-03-06 ENCOUNTER — Ambulatory Visit (INDEPENDENT_AMBULATORY_CARE_PROVIDER_SITE_OTHER): Payer: Medicaid Other | Admitting: Obstetrics and Gynecology

## 2019-03-06 VITALS — BP 125/80 | HR 60 | Wt 179.0 lb

## 2019-03-06 DIAGNOSIS — Z23 Encounter for immunization: Secondary | ICD-10-CM | POA: Diagnosis not present

## 2019-03-06 DIAGNOSIS — Z9889 Other specified postprocedural states: Secondary | ICD-10-CM

## 2019-03-06 NOTE — Progress Notes (Signed)
[facility] 03/06/2019  CC: regular post op visit  Subjective:  She is s/p 11/25 hysteroscopy, myosure polypectomy for AUB; she was discharged from the PACU. 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  She had one period since the surgery and it was approx one week and normal and no bleeding in between   Review of Systems Pertinent items are noted in HPI.    Objective:    BP 125/80   Pulse 60   Wt 179 lb (81.2 kg)   LMP 02/12/2019 (Exact Date)   BMI 31.71 kg/m  General:  alert, NAD     Assessment:    Doing well postoperatively. Operative findings again reviewed. Pathology report discussed.    Plan:   I told her post op outcomes visually looked good and her s/s matches up with that. She isn't on [facility] and declines any, which is fine, but I told her that this should improve her fertility. I told her if s/s come back again and repeat polyps seen and not trying to get pregnant that at that point I would recommend hormones such as a mirena, OCPs, etc. Repeat pap in 2-3 years  RTC PRN  Cornelia Copa MD Attending [facility] for [employer] (Faculty Practice) 03/06/2019 Time: [phone]

## 2019-04-14 ENCOUNTER — Encounter: Payer: Self-pay | Admitting: Obstetrics and Gynecology

## 2019-04-14 ENCOUNTER — Other Ambulatory Visit: Payer: Self-pay | Admitting: Obstetrics and Gynecology

## 2019-04-14 MED ORDER — TRANEXAMIC ACID 650 MG PO TABS: 1300.0000 mg | ORAL_TABLET | Freq: Three times a day (TID) | ORAL | 2 refills | Status: DC

## 2019-06-30 DIAGNOSIS — Z03818 Encounter for observation for suspected exposure to other biological agents ruled out: Secondary | ICD-10-CM | POA: Diagnosis not present

## 2020-09-25 DIAGNOSIS — Z20822 Contact with and (suspected) exposure to covid-19: Secondary | ICD-10-CM | POA: Diagnosis not present

## 2020-10-02 DIAGNOSIS — Z20822 Contact with and (suspected) exposure to covid-19: Secondary | ICD-10-CM | POA: Diagnosis not present

## 2022-03-06 NOTE — Progress Notes (Unsigned)
32 y.o. [id number] Significant Other Asian female here for NEW GYN.  Pt has hx endometrial polyps and bled 32 days straight this last month. Uses a pad and a Diva cup.  Pad change 4 times a day and cup change 4 - 5 times a day.  No pain with bleeding.   Considering pregnancy.   Hx dilation and curettage 01/11/19 for abnormal uterine bleeding.  Final pathology showed endometrioid polyps.  In the last year, her cycles have been irregular. Occurring every 2 weeks to every 45 days.  Bleeding lasts 2 - 3 weeks.  Stopped bleeding one week ago.   She has frequency and dysuria and pelvic pressure.  Taking Pyridium intermittently.   Noting weight gain.   Has 2 sons.   PCP:  None     Patient's last menstrual period was 02/08/2022.     Period Pattern: (!) Irregular Menstrual Flow: Heavy     Sexually active: Yes.    The current method of family planning is none.    Exercising: No.     Smoker:  no  Health Maintenance: Pap:  11/23/18 neg, 06/06/14 neg History of abnormal Pap:  no MMG:  n/a Colonoscopy:  n/a BMD:   n/a  Result  n/a TDaP:  08/01/2014 Gardasil:   no   reports that she has never smoked. She has never used smokeless tobacco. She reports that she does not drink alcohol and does not use drugs.  Past Medical History:  Diagnosis Date   Abnormal uterine bleeding (AUB)     Past Surgical History:  Procedure Laterality Date   HYSTEROSCOPY WITH D & C N/A 01/11/2019   Procedure: DILATATION AND CURETTAGE /HYSTEROSCOPY WITH MYOSURE POLYPECTOMY;  Surgeon: Aletha Halim, MD;  Location: [facility];  Service: Gynecology;  Laterality: N/A;   MOUTH SURGERY      Current Outpatient Medications  Medication Sig Dispense Refill   acetaminophen (TYLENOL) 500 MG tablet Take 1 tablet (500 mg total) by mouth every 6 (six) hours as needed. (Patient not taking: Reported on 03/06/2019) 20 tablet 0   ibuprofen (ADVIL) 600 MG tablet Take 1 tablet (600 mg total) by mouth every 6  (six) hours as needed. (Patient not taking: Reported on 03/06/2019) 20 tablet 0   oxyCODONE-acetaminophen (PERCOCET/ROXICET) 5-325 MG tablet Take 1 tablet by mouth every 6 (six) hours as needed. (Patient not taking: Reported on 03/06/2019) 3 tablet 0   tranexamic acid (LYSTEDA) 650 MG TABS tablet Take 2 tablets (1,300 mg total) by mouth 3 (three) times daily. Take during menses for a maximum of five days (Patient not taking: Reported on 03/11/2022) 30 tablet 2   No current facility-administered medications for this visit.    Family History  Problem Relation Age of Onset   Hypertension Mother    Hyperthyroidism Mother    Diabetes Mother     Review of Systems  All other systems reviewed and are negative.   Exam:   BP 124/82 (BP Location: Left Arm, Patient Position: Sitting, Cuff Size: Normal)   Pulse 84   Ht 5\' 3"  (1.6 m)   Wt 189 lb (85.7 kg)   LMP 02/08/2022   SpO2 97%   BMI 33.48 kg/m     General appearance: alert, cooperative and appears stated age  Pelvic: External genitalia:  no lesions              No abnormal inguinal nodes palpated.              Urethra:  normal appearing urethra with no masses, tenderness or lesions              Bartholins and Skenes: normal                 Vagina: normal appearing vagina with normal color and discharge, no lesions              Cervix: no lesions.   No blood today.                Bimanual Exam:  Uterus:  normal size, contour, position, consistency, mobility, non-tender              Adnexa: no mass, fullness, tenderness             Chaperone was present for exam:  Raquel Sarna  Assessment:     Irregular menses.  Pelvic pressure. Hx endometrial polyp.  Weight gain.    Plan:   UPT negative Urinalysis:  sg 1.026, ph 5.5, 0 - 5 WNC, NS RBC, 6 - 10 epis, few bacteria, moderate mucus.  Reflex urine culture.  Prolactin, TSH. FSH, LH, CBC. Return for sonohysterogram.  Follow up annually and prn.   After visit summary provided.   45  min  total time was spent for this patient encounter, including preparation, face-to-face counseling with the patient, coordination of care, and documentation of the encounter.

## 2022-03-11 ENCOUNTER — Ambulatory Visit: Payer: Medicaid Other | Admitting: Obstetrics and Gynecology

## 2022-03-11 ENCOUNTER — Encounter: Payer: Self-pay | Admitting: Obstetrics and Gynecology

## 2022-03-11 VITALS — BP 124/82 | HR 84 | Ht 63.0 in | Wt 189.0 lb

## 2022-03-11 DIAGNOSIS — N926 Irregular menstruation, unspecified: Secondary | ICD-10-CM | POA: Diagnosis not present

## 2022-03-11 DIAGNOSIS — R102 Pelvic and perineal pain: Secondary | ICD-10-CM

## 2022-03-11 DIAGNOSIS — R635 Abnormal weight gain: Secondary | ICD-10-CM | POA: Diagnosis not present

## 2022-03-11 LAB — PREGNANCY, URINE: Preg Test, Ur: NEGATIVE

## 2022-03-11 NOTE — Patient Instructions (Signed)
Sonohysterogram  A sonohysterogram is a procedure to look at the inside of the uterus (womb). This procedure uses sound waves that are sent to a computer to make images of the lining of the uterus (endometrium). To get the best images, a germ-free (sterile) saline solution is put into the uterus through the vagina. This solution is made of salt and water. You may have this procedure if you have certain reproductive problems, such as: Abnormal uterine bleeding. Infertility. Repeated miscarriage. This procedure can show what may be causing these problems. Possible causes include scarring or abnormal growths, such as fibroids or polyps, inside your uterus. It can also show if your uterus is an abnormal shape, or if there are any problems with the lining of your uterus. Tell a health care provider about: Any allergies you have. All medicines you are taking, including vitamins, herbs, eye drops, creams, and over-the-counter medicines. The date of the first day of your last menstrual period or whether you are pregnant or may be pregnant. The procedure will not be done if you are pregnant. Any signs of infection, such as fever, pain in your lower abdomen, or abnormal discharge from your vagina. The procedure will not be done if you have an infection. Any bleeding problems you have. Any medical conditions or surgeries you have had. Any problems you or family members have had with anesthetic medicines. What are the risks? Generally, this is a safe procedure. However, problems may occur, including: Pain or fever a day or two after the procedure. Increased vaginal discharge. Infection. What happens before the procedure? You may be asked to take a pregnancy test. This is usually in the form of a urine test. You may be given medicine to stop any abnormal bleeding. Your health care provider may have you take an over-the-counter pain medicine. You will be asked to empty your bladder. You will be asked to  undress from the waist down. You will lie down on the exam table with your feet in stirrups or with your knees bent and your feet flat on the table. You may have a pelvic exam. What happens during the procedure? You will have a transvaginal ultrasound. This is a test that uses sound waves to take pictures of the female genital tract. The pictures are taken with a device, called a transducer, that is placed in the vagina. For this test: The transducer will have a slippery substance put on it and will be placed into your vagina. The transducer will be positioned to send sound waves to your uterus. The sound waves will be sent to a computer and turned into images, which your health care provider will see during the procedure. The transducer will be removed from your vagina. Fluid will be put into your uterus. To do this: An instrument called a speculum will be put into your vagina to widen the opening. A swab with germ-killing (antiseptic) saline solution will be used to clean the opening to your uterus (cervix). A long, thin tube (catheter) will be placed through your cervix into your uterus, and the speculum will be removed. The transvaginal ultrasound will be repeated. The ultrasound transducer will be put into your vagina again to take more images. Your uterus will be slowly filled with a sterile saline solution through the catheter. You may feel some cramping. If your fallopian tubes need to be checked, fluid containing air bubbles will be placed. The transducer and catheter will be removed. The procedure may vary among health care providers   and hospitals. What can I expect after the procedure? Your blood pressure, heart rate, breathing rate, and blood oxygen level will be monitored until you leave the hospital or clinic. It is up to you to get the results of your procedure. Ask your health care provider, or the department that is doing the procedure, when your results will be ready. Contact a  health care provider if you have: Chills or a fever. Pain or cramping that does not go away even with medicine. An increase in vaginal discharge. Vaginal discharge that is yellow or green in color and has a bad smell. Nausea. Get help right away if you have: Severe pain in your abdomen. Heavy bleeding from your vagina. These symptoms may be an emergency. Get help right away. Call 911. Do not wait to see if the symptoms will go away. Do not drive yourself to the hospital. Summary A sonohysterogram is a procedure that creates images of the inside of the uterus. You may have this procedure if you have certain reproductive problems, such as abnormal bleeding, infertility, or repeated miscarriage. You may need to have a pelvic exam and take a pregnancy test before this procedure. The procedure will not be done if you are pregnant or have an infection. It is up to you to get the results of your procedure. Ask your health care provider, or the department that is doing the procedure, when your results will be ready. This information is not intended to replace advice given to you by your health care provider. Make sure you discuss any questions you have with your health care provider. Document Revised: 04/08/2021 Document Reviewed: 04/08/2021 Elsevier Patient Education  2023 Elsevier Inc.  

## 2022-03-12 LAB — FSH/LH
FSH: 4.8 m[IU]/mL
LH: 3.9 m[IU]/mL

## 2022-03-12 LAB — CBC
HCT: 40.6 % (ref 35.0–45.0)
Hemoglobin: 13.7 g/dL (ref 11.7–15.5)
MCH: 28.9 pg (ref 27.0–33.0)
MCHC: 33.7 g/dL (ref 32.0–36.0)
MCV: 85.7 fL (ref 80.0–100.0)
MPV: 10 fL (ref 7.5–12.5)
Platelets: 323 10*3/uL (ref 140–400)
RBC: 4.74 10*6/uL (ref 3.80–5.10)
RDW: 12.6 % (ref 11.0–15.0)
WBC: 7.6 10*3/uL (ref 3.8–10.8)

## 2022-03-12 LAB — TSH: TSH: 3.09 mIU/L

## 2022-03-12 LAB — PROLACTIN: Prolactin: 6.3 ng/mL

## 2022-03-13 LAB — URINALYSIS, COMPLETE W/RFL CULTURE
Bilirubin Urine: NEGATIVE
Glucose, UA: NEGATIVE
Hgb urine dipstick: NEGATIVE
Hyaline Cast: NONE SEEN /LPF
Ketones, ur: NEGATIVE
Nitrites, Initial: NEGATIVE
Protein, ur: NEGATIVE
RBC / HPF: NONE SEEN /HPF (ref 0–2)
Specific Gravity, Urine: 1.026 (ref 1.001–1.035)
pH: 5.5 (ref 5.0–8.0)

## 2022-03-13 LAB — URINE CULTURE
MICRO NUMBER:: 14466939
SPECIMEN QUALITY:: ADEQUATE

## 2022-03-13 LAB — CULTURE INDICATED

## 2022-04-10 NOTE — Progress Notes (Signed)
GYNECOLOGY  VISIT   HPI: 32 y.o.   Significant Other  Caucasian  female   G2P2002 with Patient's last menstrual period was 03/30/2022 (exact date).   here for sonohysterogram and possible endometrial biopsy.   She has irregular menstrual cycles, occur every 2 weeks to every 45 days.  Bleeding can last for weeks.  LMP 03/30/22 and ended 04/16/22.  UPT negative.   GYNECOLOGIC HISTORY: Patient's last menstrual period was 03/30/2022 (exact date). Contraception:  n/a Menopausal hormone therapy:  n/a Last mammogram:  n/a Last pap smear:   11/23/18 neg, 06/06/14 neg        OB History     Gravida  2   Para  2   Term  2   Preterm      AB      Living  2      SAB      IAB      Ectopic      Multiple  0   Live Births  2              There are no problems to display for this patient.   Past Medical History:  Diagnosis Date   Abnormal uterine bleeding (AUB)     Past Surgical History:  Procedure Laterality Date   HYSTEROSCOPY WITH D & C N/A 01/11/2019   Procedure: DILATATION AND CURETTAGE /HYSTEROSCOPY WITH MYOSURE POLYPECTOMY;  Surgeon: Aletha Halim, MD;  Location: [facility];  Service: Gynecology;  Laterality: N/A;   MOUTH SURGERY      Current Outpatient Medications  Medication Sig Dispense Refill   acetaminophen (TYLENOL) 500 MG tablet Take 1 tablet (500 mg total) by mouth every 6 (six) hours as needed. (Patient not taking: Reported on 03/06/2019) 20 tablet 0   ibuprofen (ADVIL) 600 MG tablet Take 1 tablet (600 mg total) by mouth every 6 (six) hours as needed. (Patient not taking: Reported on 03/06/2019) 20 tablet 0   oxyCODONE-acetaminophen (PERCOCET/ROXICET) 5-325 MG tablet Take 1 tablet by mouth every 6 (six) hours as needed. (Patient not taking: Reported on 03/06/2019) 3 tablet 0   tranexamic acid (LYSTEDA) 650 MG TABS tablet Take 2 tablets (1,300 mg total) by mouth 3 (three) times daily. Take during menses for a maximum of five days (Patient  not taking: Reported on 03/11/2022) 30 tablet 2   No current facility-administered medications for this visit.     ALLERGIES: Patient has no known allergies.  Family History  Problem Relation Age of Onset   Hypertension Mother    Hyperthyroidism Mother    Diabetes Mother     Social History   Socioeconomic History   Marital status: Significant Other    Spouse name: Not on file   Number of children: Not on file   Years of education: Not on file   Highest education level: Not on file  Occupational History   Not on file  Tobacco Use   Smoking status: Never   Smokeless tobacco: Never  Vaping Use   Vaping Use: Never used  Substance and Sexual Activity   Alcohol use: No   Drug use: No   Sexual activity: Yes    Birth control/protection: None  Other Topics Concern   Not on file  Social History Narrative   Not on file   Social Determinants of Health   Financial Resource Strain: Not on file  Food Insecurity: Not on file  Transportation Needs: Not on file  Physical Activity: Not on file  Stress:  Not on file  Social Connections: Not on file  Intimate Partner Violence: Not on file    Review of Systems  See HPI.  PHYSICAL EXAMINATION:    BP 110/80   Pulse 83   LMP 03/30/2022 (Exact Date)   SpO2 98%     General appearance: alert, cooperative and appears stated age  Pelvic US: Uterus 9.36 x 6.63 x 5.26 cm.  No myometrial masses. EMS 15.08 cm.  Thickened and irregular.  Intracavitary mass suspected with feeder vessels noted.  Left ovary 3.45 x 6.63 x 5.26 cm. 9.9 mm follicle.  Normal follicle pattern. Right ovary 3.14 x 1.84 x 1.91 cm.  Normal follicle pattern.  No adnexal masses.  No free fluid.   Sonohysterogram: Consent done.  Hibiclens prep. Tenaculum to posterior and then anterior cervical lip.  Os finder used.  Cannula passed and normal saline injected into endometrial canal with sterile technique.  Multiple filling defects noted anteriorly and  posteriorly.   EMB: Consent done.  Hibiclens prep. Tenaculum to anterior cervical lip.  Os finder used. Pipelle passed to 8 cm x 3  Tissue to pathology.  Minimal EBL. No complications.   Chaperone was present for exam:  Ivin Booty, [place] tech  ASSESSMENT  Irregular menses.  Hx polypoid endometrium.   PLAN  FU endometrial biopsy.  Return to office in 7 - 10 days to discuss results and treatment options.   I briefly mentioned Birth control pills, Mirena IUD, and hysteroscopy with dilation and curettage.   An After Visit Summary was printed and given to the patient.

## 2022-04-23 ENCOUNTER — Ambulatory Visit (INDEPENDENT_AMBULATORY_CARE_PROVIDER_SITE_OTHER): Payer: Medicaid Other

## 2022-04-23 ENCOUNTER — Encounter: Payer: Self-pay | Admitting: Obstetrics and Gynecology

## 2022-04-23 ENCOUNTER — Other Ambulatory Visit: Payer: Self-pay | Admitting: Obstetrics and Gynecology

## 2022-04-23 ENCOUNTER — Other Ambulatory Visit (HOSPITAL_COMMUNITY)
Admission: RE | Admit: 2022-04-23 | Discharge: 2022-04-23 | Disposition: A | Discharge status: Home / Self Care | Payer: Medicaid Other | Source: Ambulatory Visit | Attending: Obstetrics and Gynecology | Admitting: Obstetrics and Gynecology

## 2022-04-23 ENCOUNTER — Ambulatory Visit (INDEPENDENT_AMBULATORY_CARE_PROVIDER_SITE_OTHER): Payer: Medicaid Other | Admitting: Obstetrics and Gynecology

## 2022-04-23 VITALS — BP 110/80 | HR 83

## 2022-04-23 DIAGNOSIS — N926 Irregular menstruation, unspecified: Secondary | ICD-10-CM | POA: Insufficient documentation

## 2022-04-23 DIAGNOSIS — Z01812 Encounter for preprocedural laboratory examination: Secondary | ICD-10-CM | POA: Diagnosis not present

## 2022-04-23 LAB — PREGNANCY, URINE: Preg Test, Ur: NEGATIVE

## 2022-04-23 NOTE — Patient Instructions (Signed)
Endometrial Biopsy  An endometrial biopsy is a procedure to remove tissue samples from the endometrium, which is the lining of the uterus. The tissue that is removed can then be checked under a microscope for disease. This procedure is used to diagnose conditions such as endometrial cancer, endometrial tuberculosis, polyps, or other inflammatory conditions. This procedure may also be used to investigate uterine bleeding to determine where you are in your menstrual cycle or how your hormone levels are affecting the lining of the uterus. Tell a health care provider about: Any allergies you have. All medicines you are taking, including vitamins, herbs, eye drops, creams, and over-the-counter medicines. Any problems you or family members have had with anesthetic medicines. Any bleeding problems you have. Any surgeries you have had. Any medical conditions you have. Whether you are pregnant or may be pregnant. What are the risks? Your health care provider will talk with you about risks. These may include: Bleeding. Pelvic infection. Puncture of the wall of the uterus with the biopsy device (rare). Allergic reactions to medicines. What happens before the procedure? Keep a record of your menstrual cycles as told by your health care provider. You may need to schedule your procedure for a specific time in your cycle. Bring a sanitary pad in case you need to wear one after the procedure. Ask your health care provider about: Changing or stopping your regular medicines. These include any diabetes medicines or blood thinners you take. Taking medicines such as aspirin and ibuprofen. These medicines can thin your blood. Do not take these medicines unless your health care provider tells you to. Taking over-the-counter medicines, vitamins, herbs, and supplements. Plan to have someone take you home from the hospital or clinic. What happens during the procedure? You will lie on an exam table with your feet  and legs supported as in a pelvic exam. Your health care provider will insert an instrument into your vagina to see your cervix. Your cervix will be cleansed with an antiseptic solution. A medicine (local anesthetic) will be used to numb the cervix. A forceps instrument will be used to hold your cervix steady for the biopsy. A thin, rod-like instrument (uterine sound) will be inserted through your cervix to determine the length of your uterus and the location where the biopsy sample will be removed. A thin, flexible tube (catheter) will be inserted through your cervix and into the uterus. The catheter will be used to collect the biopsy sample from your endometrial tissue. The tube and instruments will be removed, and the tissue sample will be sent to a lab for examination. The procedure may vary among health care providers and hospitals. What happens after the procedure? Your blood pressure, heart rate, breathing rate, and blood oxygen level will be monitored until you leave the hospital or clinic. It is up to you to get the results of your procedure. Ask your health care provider, or the department that is doing the procedure, when your results will be ready. Summary An endometrial biopsy is a procedure to remove tissue samples from the endometrium, which is the lining of the uterus. This procedure is used to diagnose conditions such as endometrial cancer, endometrial tuberculosis, polyps, or other inflammatory conditions. It is up to you to get the results of your procedure. Ask your health care provider, or the department that is doing the procedure, when your results will be ready. This information is not intended to replace advice given to you by your health care provider. Make sure you  discuss any questions you have with your health care provider. Document Revised: 05/20/2021 Document Reviewed: 05/20/2021 Elsevier Patient Education  [place].

## 2022-04-27 ENCOUNTER — Encounter: Payer: Self-pay | Admitting: Obstetrics and Gynecology

## 2022-04-27 LAB — SURGICAL PATHOLOGY

## 2022-04-28 NOTE — Telephone Encounter (Signed)
Please contact patient in follow up to message sent through My Chart about her bleeding post sonohysterogram.   She has had ongoing abnormal uterine bleeding.   She will return for a consultation tomorrow for discussion of treatment options.   She may treat with a course of Provera 10 mg x 10 days to try to stop current bleeding.  She needs to do a negative urine pregnancy test first if she is sexually active at all.   I will see her tomorrow in the office.

## 2022-04-29 ENCOUNTER — Ambulatory Visit: Payer: Medicaid Other | Admitting: Obstetrics and Gynecology

## 2022-04-29 ENCOUNTER — Encounter: Payer: Self-pay | Admitting: Obstetrics and Gynecology

## 2022-04-29 VITALS — BP 110/64 | HR 79 | Ht 63.0 in | Wt 189.0 lb

## 2022-04-29 DIAGNOSIS — N938 Other specified abnormal uterine and vaginal bleeding: Secondary | ICD-10-CM | POA: Diagnosis not present

## 2022-04-29 MED ORDER — MEDROXYPROGESTERONE ACETATE 10 MG PO TABS: 10.0000 mg | ORAL_TABLET | Freq: Every day | ORAL | 1 refills | Status: DC

## 2022-04-29 NOTE — Progress Notes (Signed)
GYNECOLOGY  VISIT   HPI: 32 y.o.   Significant Other  Asian  female   G2P2002 with Patient's last menstrual period was 03/30/2022 (exact date).   here for   f/u to discuss abnormal uterine bleeding, sonohysterogram and endometrial biopsy.   Patient has had evaluation for irregular and prolonged menstrual bleeding.  Her [place] on 04/23/22 showed thickened endometrium of 15.08 mm with suspected feeder vessels. Sonohysterogram showed multiple filling defects.  Her endometrial biopsy showed benign proliferative endometrium.  No hyperplasia or malignancy seen.   She has had bleeding since the sonohyserogram.  It is lessening now.   She acknowledges weight gain.   Does not like the idea of birth control.  She desires another pregnancy.   Last sexual intercourse was about 1 week ago.  No contraception used.    GYNECOLOGIC HISTORY: Patient's last menstrual period was 03/30/2022 (exact date). Contraception:  n/a Menopausal hormone therapy:  n/a Last mammogram:  n/a Last pap smear:   11/23/18 neg, 06/06/14 neg               OB History     Gravida  2   Para  2   Term  2   Preterm      AB      Living  2      SAB      IAB      Ectopic      Multiple  0   Live Births  2              There are no problems to display for this patient.   Past Medical History:  Diagnosis Date   Abnormal uterine bleeding (AUB)     Past Surgical History:  Procedure Laterality Date   HYSTEROSCOPY WITH D & C N/A 01/11/2019   Procedure: DILATATION AND CURETTAGE /HYSTEROSCOPY WITH MYOSURE POLYPECTOMY;  Surgeon: Aletha Halim, MD;  Location: [facility];  Service: Gynecology;  Laterality: N/A;   MOUTH SURGERY      Current Outpatient Medications  Medication Sig Dispense Refill   medroxyPROGESTERone (PROVERA) 10 MG tablet Take 1 tablet (10 mg total) by mouth daily. Take one tablet by mouth daily for 10 days per month. 30 tablet 1   No current facility-administered  medications for this visit.     ALLERGIES: Patient has no known allergies.  Family History  Problem Relation Age of Onset   Hypertension Mother    Hyperthyroidism Mother    Diabetes Mother     Social History   Socioeconomic History   Marital status: Significant Other    Spouse name: Not on file   Number of children: Not on file   Years of education: Not on file   Highest education level: Not on file  Occupational History   Not on file  Tobacco Use   Smoking status: Never   Smokeless tobacco: Never  Vaping Use   Vaping Use: Never used  Substance and Sexual Activity   Alcohol use: No   Drug use: No   Sexual activity: Yes    Birth control/protection: None  Other Topics Concern   Not on file  Social History Narrative   Not on file   Social Determinants of Health   Financial Resource Strain: Not on file  Food Insecurity: Not on file  Transportation Needs: Not on file  Physical Activity: Not on file  Stress: Not on file  Social Connections: Not on file  Intimate Partner Violence: Not on file  Review of Systems  All other systems reviewed and are negative.   PHYSICAL EXAMINATION:    BP 110/64 (BP Location: Left Arm, Patient Position: Sitting, Cuff Size: Large)   Pulse 79   Ht '5\' 3"'$  (1.6 m)   Wt 189 lb (85.7 kg)   LMP 03/30/2022 (Exact Date)   SpO2 97%   BMI 33.48 kg/m     General appearance: alert, cooperative and appears stated age   ASSESSMENT  Dysfunctional uterine bleeding.  I suspect anovulation.  Hx hysteroscopy with dilation and curettage for endometrial polyp.  PLAN  We reviewed her evaluation to date.  We discussed treatment options of cyclic Provera, birth control, and hysteroscopy/dilation and curettage.   Will proceed with Provera 10 mg x 10 days per month.  #30, RF one.  She understand this is not medication to prevent pregnancy and that a urine pregnancy testing must be negative prior to taking this medication each month.  Weight loss  recommended to potentially return menstrual cycles to normal and increase fertility.  Would try to avoid hysteroscopy and dilation and curettage to reduce risk of intrauterine scarring.  We reviewed potential medication for ovulation induction if she is determined to be nonovulatory.  FU in 3 months for annual exam and recheck.    An After Visit Summary was printed and given to the patient.  30 min  total time was spent for this patient encounter, including preparation, face-to-face counseling with the patient, coordination of care, and documentation of the encounter.

## 2022-04-30 ENCOUNTER — Ambulatory Visit: Payer: Medicaid Other | Admitting: Obstetrics and Gynecology

## 2022-07-16 NOTE — Progress Notes (Deleted)
32 y.o. [id number] Significant Other Asian female here for annual exam.    PCP:     No LMP recorded.           Sexually active: {yes no:314532}  The current method of family planning is none.    Exercising: {yes no:314532}  {types:19826} Smoker:  no  Health Maintenance: Pap:  11/23/18 neg, 06/06/14 neg History of abnormal Pap:  no MMG:  n/a Colonoscopy:  n/a BMD:   n/a  Result  n/a TDaP:  08/01/14 Gardasil:   {YES AV:40981} HIV: 08/01/14 NR Hep C: n/a Screening Labs:  Hb today: ***, Urine today: ***   reports that she has never smoked. She has never used smokeless tobacco. She reports that she does not drink alcohol and does not use drugs.  Past Medical History:  Diagnosis Date   Abnormal uterine bleeding (AUB)     Past Surgical History:  Procedure Laterality Date   HYSTEROSCOPY WITH D & C N/A 01/11/2019   Procedure: DILATATION AND CURETTAGE /HYSTEROSCOPY WITH MYOSURE POLYPECTOMY;  Surgeon: Brewster Hill Bing, MD;  Location: [facility];  Service: Gynecology;  Laterality: N/A;   MOUTH SURGERY      Current Outpatient Medications  Medication Sig Dispense Refill   medroxyPROGESTERone (PROVERA) 10 MG tablet Take 1 tablet (10 mg total) by mouth daily. Take one tablet by mouth daily for 10 days per month. 30 tablet 1   No current facility-administered medications for this visit.    Family History  Problem Relation Age of Onset   Hypertension Mother    Hyperthyroidism Mother    Diabetes Mother     Review of Systems  Exam:   There were no vitals taken for this visit.    General appearance: alert, cooperative and appears stated age Head: normocephalic, without obvious abnormality, atraumatic Neck: no adenopathy, supple, symmetrical, trachea midline and thyroid normal to inspection and palpation Lungs: clear to auscultation bilaterally Breasts: normal appearance, no masses or tenderness, No nipple retraction or dimpling, No nipple discharge or bleeding, No  axillary adenopathy Heart: regular rate and rhythm Abdomen: soft, non-tender; no masses, no organomegaly Extremities: extremities normal, atraumatic, no cyanosis or edema Skin: skin color, texture, turgor normal. No rashes or lesions Lymph nodes: cervical, supraclavicular, and axillary nodes normal. Neurologic: grossly normal  Pelvic: External genitalia:  no lesions              No abnormal inguinal nodes palpated.              Urethra:  normal appearing urethra with no masses, tenderness or lesions              Bartholins and Skenes: normal                 Vagina: normal appearing vagina with normal color and discharge, no lesions              Cervix: no lesions              Pap taken: {yes no:314532} Bimanual Exam:  Uterus:  normal size, contour, position, consistency, mobility, non-tender              Adnexa: no mass, fullness, tenderness              Rectal exam: {yes no:314532}.  Confirms.              Anus:  normal sphincter tone, no lesions  Chaperone was present for exam:  ***  Assessment:  Well woman visit with gynecologic exam.   Plan: Mammogram screening discussed. Self breast awareness reviewed. Pap and HR HPV as above. Guidelines for Calcium, Vitamin D, regular exercise program including cardiovascular and weight bearing exercise.   Follow up annually and prn.   Additional counseling given.  {yes [id number]. _______ minutes face to face time of which over 50% was spent in counseling.    After visit summary provided.

## 2022-07-30 ENCOUNTER — Ambulatory Visit: Payer: Medicaid Other | Admitting: Obstetrics and Gynecology

## 2022-10-22 NOTE — Progress Notes (Signed)
32 y.o. [id number] Significant Other Asian female here for annual exam.   Patient has a hx of dysfunctional uterine bleeding and was seen for a first visit at this office on 03/11/22 for menorrhagia with irregular cycles.   Her labs from 03/11/22:  FSH 4.8, LH 3.9, prolactin 6.3, TSH 3.09.  Had sonohysterogram and endometrial biopsy 04/23/22. The pelvic US that day showed endometrial thickening of 15.08 mm and the sonohysterogram showed multiple filling defects.  Her endometrial biopsy showed proliferative endometrium without hyperplasia or malignancy.    She had ongoing bleeding following her procedure and was treated with a course of Provera x 10 days.  Her withdrawal bleed was heavy, and she was treated with an additional course of Provera for 10 more days and started on monthly cyclic Provera.   Her cycles following this were: April for 6 days.  May for 5 days following a course of Provera.  No period for June or July. August 8, lasted 5 days, spontaneous cycle. September 19, today, spontaneous cycle.  Not preventing pregnancy but would welcome it.  She and her partner have 2 children together.  They had no difficulty achieving pregnancy previously.   She had a hysteroscopy with dilation and curettage for an endometrial polyp 01/11/19.   PCP:   none  Patient's last menstrual period was 11/05/2022.     Period Duration (Days): 5 Period Pattern: (!) Irregular Menstrual Flow: Moderate Menstrual Control: Other (Comment) (deva cup) Dysmenorrhea: (!) Mild  Cycles come every 40-50 days     Sexually active: Yes.    The current method of family planning is none.      Exercising: No.   Smoker:  no  Health Maintenance: Pap:  11/23/18 neg, 06/06/14 neg History of abnormal Pap:  no MMG:  n/a Colonoscopy:  n/a BMD:   n/a  Result  n/a TDaP:  08/01/14 Gardasil:   no HIV: 08/01/14 NR Hep C: n/a   reports that she has never smoked. She has never used smokeless tobacco. She reports that she  does not drink alcohol and does not use drugs.  Past Medical History:  Diagnosis Date   Abnormal uterine bleeding (AUB)     Past Surgical History:  Procedure Laterality Date   HYSTEROSCOPY WITH D & C N/A 01/11/2019   Procedure: DILATATION AND CURETTAGE /HYSTEROSCOPY WITH MYOSURE POLYPECTOMY;  Surgeon: Makanda Bing, MD;  Location: [facility];  Service: Gynecology;  Laterality: N/A;   MOUTH SURGERY      Current Outpatient Medications  Medication Sig Dispense Refill   medroxyPROGESTERone (PROVERA) 10 MG tablet Take 1 tablet (10 mg total) by mouth daily. Take one tablet by mouth daily for 10 days per month. 30 tablet 3   No current facility-administered medications for this visit.    Family History  Problem Relation Age of Onset   Hypertension Mother    Hyperthyroidism Mother    Diabetes Mother    Diabetes type II Brother    Gout Brother     Review of Systems  All other systems reviewed and are negative.   Exam:   BP 116/64 (BP Location: Right Arm, Patient Position: Sitting, Cuff Size: Normal)   Pulse 85   Ht 5' 3.75" (1.619 m)   Wt 186 lb (84.4 kg)   LMP 11/05/2022   SpO2 99%   BMI 32.18 kg/m     General appearance: alert, cooperative and appears stated age Head: normocephalic, without obvious abnormality, atraumatic Neck: no adenopathy, supple, symmetrical, trachea midline  and thyroid normal to inspection and palpation Lungs: clear to auscultation bilaterally Breasts: normal appearance, no masses or tenderness, No nipple retraction or dimpling, No nipple discharge or bleeding, No axillary adenopathy Heart: regular rate and rhythm Abdomen: soft, non-tender; no masses, no organomegaly Extremities: extremities normal, atraumatic, no cyanosis or edema Skin: skin color, texture, turgor normal. No rashes or lesions Lymph nodes: cervical, supraclavicular, and axillary nodes normal. Neurologic: grossly normal  Pelvic: External genitalia:  no lesions               No abnormal inguinal nodes palpated.              Urethra:  normal appearing urethra with no masses, tenderness or lesions              Bartholins and Skenes: normal                 Vagina: normal appearing vagina with normal color and discharge, no lesions              Cervix: no lesions.  Light brown blood noted.               Pap taken: yes Bimanual Exam:  Uterus:  normal size, contour, position, consistency, mobility, non-tender              Adnexa: no mass, fullness, tenderness            Chaperone was present for exam:  Warren Lacy, CMA  Assessment:   Well woman visit with gynecologic exam. Cervical cancer screening.  Irregular cycles.   Desire for future fertility.   Plan: Mammogram screening discussed. Self breast awareness reviewed. Pap and HR HPV as above. Guidelines for Calcium, Vitamin D, regular exercise program including cardiovascular and weight bearing exercise. Start PNV. Rx for cyclic Provera 10 mg x 10 days per month.  She knows to do a pregnancy test before taking the Provera each month.  She will do ovulation predictor kits to check for ovulation.  She will report back the results.  We discussed medication to prompt ovulation if she and her partner would like to actively try for pregnancy.   We reviewed avoiding exposures.  Reading material on pregnancy preparation reviewed.  Follow up annually and prn.   After visit summary provided.

## 2022-11-05 ENCOUNTER — Encounter: Payer: Self-pay | Admitting: Obstetrics and Gynecology

## 2022-11-05 ENCOUNTER — Ambulatory Visit (INDEPENDENT_AMBULATORY_CARE_PROVIDER_SITE_OTHER): Payer: Medicaid Other | Admitting: Obstetrics and Gynecology

## 2022-11-05 ENCOUNTER — Other Ambulatory Visit (HOSPITAL_COMMUNITY)
Admission: RE | Admit: 2022-11-05 | Discharge: 2022-11-05 | Disposition: A | Discharge status: Home / Self Care | Payer: Medicaid Other | Source: Ambulatory Visit | Attending: Obstetrics and Gynecology | Admitting: Obstetrics and Gynecology

## 2022-11-05 VITALS — BP 116/64 | HR 85 | Ht 63.75 in | Wt 186.0 lb

## 2022-11-05 DIAGNOSIS — Z01419 Encounter for gynecological examination (general) (routine) without abnormal findings: Secondary | ICD-10-CM | POA: Insufficient documentation

## 2022-11-05 DIAGNOSIS — Z124 Encounter for screening for malignant neoplasm of cervix: Secondary | ICD-10-CM

## 2022-11-05 MED ORDER — MEDROXYPROGESTERONE ACETATE 10 MG PO TABS: 10.0000 mg | ORAL_TABLET | Freq: Every day | ORAL | 3 refills | Status: AC

## 2022-11-05 NOTE — Patient Instructions (Signed)

## 2022-11-13 LAB — CYTOLOGY - PAP
Comment: NEGATIVE
Diagnosis: NEGATIVE
High risk HPV: NEGATIVE

## 2024-03-15 ENCOUNTER — Telehealth

## 2024-03-15 DIAGNOSIS — S93401A Sprain of unspecified ligament of right ankle, initial encounter: Secondary | ICD-10-CM

## 2024-03-15 DIAGNOSIS — M25571 Pain in right ankle and joints of right foot: Secondary | ICD-10-CM

## 2024-03-15 NOTE — Patient Instructions (Signed)
" °  Deanna Thornton, thank you for joining Hadassah Fireman, NP for today's virtual visit.  While this provider is not your primary care provider (PCP), if your PCP is located in our provider database this encounter information will be shared with them immediately following your visit.   A [facility] MyChart account gives you access to today's visit and all your visits, tests, and labs performed at [facility]  click here if you don't have a [facility] MyChart account or go to [email or web address]  Consent: (Patient) Deanna Thornton provided verbal consent for this virtual visit at the beginning of the encounter.  Current Medications:  Current Outpatient Medications:    medroxyPROGESTERone  (PROVERA ) 10 MG tablet, Take 1 tablet (10 mg total) by mouth daily. Take one tablet by mouth daily for 10 days per month., Disp: 30 tablet, Rfl: 3   Medications ordered in this encounter:  No orders of the defined types were placed in this encounter.    *If you need refills on other medications prior to your next appointment, please contact your pharmacy*  Follow-Up: Call back or seek an in-person evaluation if the symptoms worsen or if the condition fails to improve as anticipated.  [facility] [phone]  Other Instructions Continue to rest, elevate, ice and provide compression with your ankle brace.  Please take OTC ibuprofen  and tylenol , per label dose, as needed for swelling or pain with food.  Please seek further evaluation in person if your symptoms do no improve or worsen.   If you have been instructed to have an in-person evaluation today at a local Urgent Care facility, please use the link below. It will take you to a list of all of our available [facility], including address, phone number and hours of operation. Please do not delay care.  [facility]  If you or a family member do not have a primary care provider, use the link below to  schedule a visit and establish care. When you choose a [facility] primary care physician or advanced practice provider, you gain a long-term partner in health. Find a Primary Care Provider  Learn more about [facility]'s in-office and virtual care options:  - Get Care Now  "

## 2024-03-15 NOTE — Progress Notes (Signed)
 " Virtual Visit Consent   Deanna Thornton, you are scheduled for a virtual visit with a [facility] provider today. Just as with appointments in the office, your consent must be obtained to participate. Your consent will be active for this visit and any virtual visit you may have with one of our providers in the next 365 days. If you have a MyChart account, a copy of this consent can be sent to you electronically.  As this is a virtual visit, video technology does not allow for your provider to perform a traditional examination. This may limit your provider's ability to fully assess your condition. If your provider identifies any concerns that need to be evaluated in person or the need to arrange testing (such as labs, EKG, etc.), we will make arrangements to do so. Although advances in technology are sophisticated, we cannot ensure that it will always work on either your end or our end. If the connection with a video visit is poor, the visit may have to be switched to a telephone visit. With either a video or telephone visit, we are not always able to ensure that we have a secure connection.  By engaging in this virtual visit, you consent to the provision of healthcare and authorize for your insurance to be billed (if applicable) for the services provided during this visit. Depending on your insurance coverage, you may receive a charge related to this service.  I need to obtain your verbal consent now. Are you willing to proceed with your visit today? HONESTIE KULIK has provided verbal consent on 03/15/2024 for a virtual visit (video or telephone). Hadassah Fireman, NP  Date: 03/15/2024 9:40 AM   Virtual Visit via Video Note   I, Hadassah Fireman, connected with  CHAUNCEY BRUNO  ([id number], 1990/03/24) on 03/15/24 at  9:30 AM EST by a video-enabled telemedicine application and verified that I am speaking with the correct person using two identifiers.  Location: Patient: Virtual Visit Location Patient:  Home Provider: Virtual Visit Location Provider: Home Office   I discussed the limitations of evaluation and management by telemedicine and the availability of in person appointments. The patient expressed understanding and agreed to proceed.    History of Present Illness: Deanna Thornton is a 34 y.o. who identifies as a female who was assigned female at birth, and is being seen today for discussion on how to care for sprained right ankle. States she was walking outside on Monday and rolled her right ankle due to slush/icy area outside her house. Endorses tenderness to the outside of her right ankle with swelling that has improved. She has sprained her ankle before and this feels similar/minor to previous sprains. She has been following RICE precautions and has a brace she has been wearing. Denies numbness to right foot, with some intermittent tingling when walking. Denies taking OTC ibuprofen  or tylenol  for pain. Able to move toes and ankle, with some pain. Denies bruising or redness to area.  She has not been seen in person for Xrays or eval- states roads in her neighborhood are too icy for her to drive and leave the house.   Requesting work note.   HPI: HPI  Problems: There are no active problems to display for this patient.   Allergies: Allergies[1] Medications: Current Medications[2]  Observations/Objective: Patient is well-developed, well-nourished in no acute distress.  Resting comfortably  at home.  Head is normocephalic, atraumatic.  No labored breathing.  Speech is clear and coherent with logical  content.  Patient is alert and oriented at baseline.  Right ankle/foot: Mild swelling noted to right lateral malleolus with no erythema, ecchymosis, or open wounds noted. Able to move all toes with no problems. Decreased ROM of right ankle due to pain elicited. Pallor normal for ethnicity    Assessment and Plan: 1. Acute right ankle pain (Primary)  2. Sprain of right ankle, unspecified  ligament, initial encounter  Right ankle pain x3 days after rolling in ice/snow outside of her house. PE reassuring. Most likely sprain vs. Fracture or dislocation.  Discussed continuing RICE precautions, wearing right ankle brace for support while walking, and taking OTC ibuprofen  and tylenol , per label dose, with GI and BP cautions.  Red flag s/s discussed. Advised to report for in person evaluation if symptoms do not improve or worsen. States understanding.  Work note sent to patient   Follow Up Instructions: I discussed the assessment and treatment plan with the patient. The patient was provided an opportunity to ask questions and all were answered. The patient agreed with the plan and demonstrated an understanding of the instructions.  A copy of instructions were sent to the patient via MyChart unless otherwise noted below.   Patient has requested to receive PHI (AVS, Work Notes, etc) pertaining to this video visit through e-mail as they are currently without active MyChart. They have voiced understand that email is not considered secure and their health information could be viewed by someone other than the patient.   The patient was advised to call back or seek an in-person evaluation if the symptoms worsen or if the condition fails to improve as anticipated.    Hadassah Fireman, NP     [1] No Known Allergies [2]  Current Outpatient Medications:    medroxyPROGESTERone  (PROVERA ) 10 MG tablet, Take 1 tablet (10 mg total) by mouth daily. Take one tablet by mouth daily for 10 days per month., Disp: 30 tablet, Rfl: 3  "
